# Patient Record
Sex: Female | Born: 1980 | Race: White | Hispanic: No | Marital: Married | Smoking: Never smoker
Health system: Southern US, Community
[De-identification: ages and names within clinical notes are randomized; demographics above are authoritative.]

## PROBLEM LIST (undated history)

## (undated) DIAGNOSIS — T8859XA Other complications of anesthesia, initial encounter: Secondary | ICD-10-CM

## (undated) DIAGNOSIS — Z9889 Other specified postprocedural states: Secondary | ICD-10-CM

## (undated) DIAGNOSIS — R112 Nausea with vomiting, unspecified: Secondary | ICD-10-CM

## (undated) DIAGNOSIS — D649 Anemia, unspecified: Secondary | ICD-10-CM

## (undated) DIAGNOSIS — F419 Anxiety disorder, unspecified: Secondary | ICD-10-CM

## (undated) DIAGNOSIS — T4145XA Adverse effect of unspecified anesthetic, initial encounter: Secondary | ICD-10-CM

## (undated) DIAGNOSIS — K219 Gastro-esophageal reflux disease without esophagitis: Secondary | ICD-10-CM

## (undated) HISTORY — PX: ABDOMINAL HYSTERECTOMY: SHX81

---

## 2012-10-16 HISTORY — PX: CHOLECYSTECTOMY: SHX55

## 2012-11-01 ENCOUNTER — Observation Stay: Payer: Self-pay | Admitting: Surgery

## 2012-11-01 LAB — COMPREHENSIVE METABOLIC PANEL
Alkaline Phosphatase: 71 U/L (ref 50–136)
Bilirubin,Total: 0.3 mg/dL (ref 0.2–1.0)
Calcium, Total: 9.2 mg/dL (ref 8.5–10.1)
Co2: 23 mmol/L (ref 21–32)
Creatinine: 0.73 mg/dL (ref 0.60–1.30)
EGFR (African American): >60
EGFR (Non-African Amer.): >60
Osmolality: 281 (ref 275–301)
Sodium: 140 mmol/L (ref 136–145)

## 2012-11-01 LAB — URINALYSIS, COMPLETE
Bacteria: NONE SEEN
Glucose,UR: NEGATIVE mg/dL (ref 0–75)
Ketone: NEGATIVE
Leukocyte Esterase: NEGATIVE
RBC,UR: NONE SEEN /HPF (ref 0–5)
Specific Gravity: 1.009 (ref 1.003–1.030)
WBC UR: <1 /HPF (ref 0–5)

## 2012-11-01 LAB — CBC
HCT: 35.5 % (ref 35.0–47.0)
MCH: 30.5 pg (ref 26.0–34.0)
MCHC: 34.6 g/dL (ref 32.0–36.0)

## 2012-11-01 LAB — LIPASE, BLOOD: Lipase: 180 U/L (ref 73–393)

## 2012-11-01 LAB — PREGNANCY, URINE: Pregnancy Test, Urine: NEGATIVE m[IU]/mL

## 2012-11-04 LAB — PATHOLOGY REPORT

## 2013-10-20 DIAGNOSIS — R197 Diarrhea, unspecified: Secondary | ICD-10-CM | POA: Insufficient documentation

## 2013-10-20 DIAGNOSIS — Z9889 Other specified postprocedural states: Secondary | ICD-10-CM | POA: Insufficient documentation

## 2014-06-09 DIAGNOSIS — R55 Syncope and collapse: Secondary | ICD-10-CM | POA: Insufficient documentation

## 2014-08-07 ENCOUNTER — Ambulatory Visit: Payer: Self-pay | Admitting: Gastroenterology

## 2015-02-05 NOTE — H&P (Signed)
PATIENT NAME:  Erica Wells, Erica Wells MR#:  [id number] DATE OF BIRTH:  04-23-1981  DATE OF ADMISSION:  11/01/2012  ADMITTING DIAGNOSES:  Right upper quadrant abdominal pain, possible cholecystitis.   HISTORY OF PRESENT ILLNESS:  This is a delightful 34 year old white female without past medical history except for hypertriglyceridemia who presents to the Emergency Room with the sudden onset of epigastric and right upper quadrant abdominal pain which radiated into her right  infrascapular region starting around 9:00 last night.  The pain progressed, associated with nausea, but no vomiting, no jaundice.  No sick contacts.  No change in bowel habits.  Pain became worse.  She sought treatment in the Emergency Room.  Upon arrival the patient's liver function tests appeared to be normal.  White count is normal.  Platelet normal.  Urinalysis negative.  Urine pregnancy test was negative.  Contrasted CT scan was obtained which demonstrates some inflammatory changes around the gallbladder.  There is no evidence of acute appendicitis.  Remaining solid organs to my review appeared to be normal.  Surgical services were asked to consult.   The patient does admit to having a similar, but less intense episode of pain on Monday which lasted approximately 6 hours with a pain-free interval in between.   ALLERGIES:  PENICILLIN.   MEDICATIONS:  Omeprazole, gemfibrozil and birth control pills.   PAST SURGICAL HISTORY:  None.   SOCIAL HISTORY:  The patient is employed at [employer].  Does not smoke.  Does not drink.   PHYSICAL EXAMINATION: GENERAL:  The patient is alert and oriented, anicteric.  No apparent distress.  VITAL SIGNS:  Temperature is 98, pulse is 97, blood pressure is 123/77, respiratory rate is 17. She is 5 feet, 8, 150 pounds, BMI 22.8.  HEENT AND NECK:  Supple.  No adenopathy or thyromegaly.  Extraocular muscles appear to be intact.  Facies are symmetrical and normal.  LUNGS:  Clear bilaterally.  HEART:  Regular  rate and rhythm.  No murmur.  ABDOMEN:  Soft and nontender, except in the right upper quadrant to deep palpation.  Consistent with a Murphy's sign.  EXTREMITIES:  Warm and well-perfused.  NEUROLOGIC AND PSYCHIATRIC:  Appears to be normal.  MUSCULOSKELETAL:  Normal tone and strength.   SKIN:  Warm and well-perfused.  No jaundice.  No scleral icterus.  RECTAL AND GENITOURINARY:  Deferred.   LABORATORY VALUES:  Glucose 91, BUN 19, creatinine 0.73, sodium 140, potassium 3.7, chloride 108.  Total protein is 8.4, albumin 4.1, bilirubin 0.3, alkaline phosphatase 71, AST 18, ALT 17.  White count is 8.7, hemoglobin 12.3, hematocrit 35.5, platelet count 316,000.  Urinalysis is negative.  Urine pregnancy test is negative.   IMPRESSION:  A 34 year old otherwise healthy white female presents to the Emergency Room with acute abdominal pain suspicious for cholecystitis based on history and CT scan findings.   PLAN:  The patient will be admitted, hydrated for her elevated BUN as she has some signs of mild dehydration based on that.  I will obtain an ultrasound of the right upper quadrant to further delineate the biliary anatomy.  I will have Dr. Leanora Cover check on her later today for consideration of cholecystectomy if her symptoms do not improve.  I discussed this with her.  She understands and wishes to proceed with hospital admission work-up.     ____________________________ Jeannette How. Marina Gravel, MD Ricke Hey D: 11/01/2012 06:09:40 ET T: 11/01/2012 06:38:19 ET JOB#: [id number]  cc: Elta Guadeloupe A. Marina Gravel, MD, <Dictator> Starsky Nanna Bettina Gavia MD ELECTRONICALLY SIGNED  11/01/2012 9:19 

## 2015-02-05 NOTE — Op Note (Signed)
PATIENT NAME:  Erica Wells, Erica Wells MR#:  [id number] DATE OF BIRTH:  1980-12-16  DATE OF PROCEDURE:  11/01/2012  OPERATION PERFORMED: Laparoscopic cholecystectomy.   PREOPERATIVE DIAGNOSIS: Acute cholecystitis.   POSTOPERATIVE DIAGNOSIS: Symptomatic cholelithiasis.   SURGEON: Consuela Mimes, MD   ANESTHESIA: General.   PROCEDURE IN DETAIL: The patient was placed supine on the operating room table and prepped and draped in the usual sterile fashion. A 15 mmHg CO2 pneumoperitoneum was created via a Veress needle in the infraumbilical position, and this was replaced with a 5 mm trocar and 30 degree angled scope. Remaining trocars were placed under direct visualization. The fundus of the gallbladder was retracted superiorly and ventrally, and adhesions, which were chronic in nature, were taken down from the visceral surface of the liver in segment 5 and the visceral surface of the gallbladder with the electrocautery and sharp and blunt dissection respectively. The infundibulum was then retracted laterally, opening up the triangle of Calot. Visualization was excellent, and fat in the triangle was minimal. Dissection right next to the gallbladder wall revealed a clear cystic duct and clear cystic artery. Both of these were individually doubly clipped and divided, and the gallbladder was removed from the liver bed with the electrocautery, placed in an Endo Catch bag and extracted from the abdomen via the epigastric port. This port site fascia was closed with a single 0 Vicryl suture with the laparoscopic puncture closure device, and the right upper quadrant was irrigated with warm normal saline. This was suctioned clear, and the area of dissection was reinspected, and hemostasis was excellent, and the clips were secure and there was no evidence of bile staining or leakage. Therefore, the peritoneum was desufflated and decannulated, and all 4 skin sites were closed with subcuticular 5-0 Monocryl and suture  strips. The patient tolerated the procedure well. There were no complications.    ____________________________ Consuela Mimes, MD wfm:OSi D: 11/01/2012 14:14:21 ET T: 11/02/2012 09:24:21 ET JOB#: [id number]  cc: Consuela Mimes, MD, <Dictator> Consuela Mimes MD ELECTRONICALLY SIGNED 11/03/2012 9:03

## 2015-02-08 LAB — SURGICAL PATHOLOGY

## 2016-06-27 DIAGNOSIS — R87610 Atypical squamous cells of undetermined significance on cytologic smear of cervix (ASC-US): Secondary | ICD-10-CM | POA: Insufficient documentation

## 2016-06-27 HISTORY — DX: Atypical squamous cells of undetermined significance on cytologic smear of cervix (ASC-US): R87.610

## 2017-07-23 DIAGNOSIS — F411 Generalized anxiety disorder: Secondary | ICD-10-CM | POA: Insufficient documentation

## 2017-07-23 DIAGNOSIS — F902 Attention-deficit hyperactivity disorder, combined type: Secondary | ICD-10-CM | POA: Insufficient documentation

## 2017-08-28 ENCOUNTER — Ambulatory Visit (INDEPENDENT_AMBULATORY_CARE_PROVIDER_SITE_OTHER): Payer: 59 | Admitting: Psychology

## 2017-08-28 DIAGNOSIS — F411 Generalized anxiety disorder: Secondary | ICD-10-CM | POA: Diagnosis not present

## 2017-09-04 ENCOUNTER — Ambulatory Visit: Payer: 59 | Admitting: Psychology

## 2017-09-26 ENCOUNTER — Ambulatory Visit (INDEPENDENT_AMBULATORY_CARE_PROVIDER_SITE_OTHER): Payer: 59 | Admitting: Psychology

## 2017-09-26 DIAGNOSIS — F411 Generalized anxiety disorder: Secondary | ICD-10-CM | POA: Diagnosis not present

## 2017-10-15 ENCOUNTER — Other Ambulatory Visit: Payer: Self-pay | Admitting: Orthopedic Surgery

## 2017-10-15 DIAGNOSIS — M1611 Unilateral primary osteoarthritis, right hip: Secondary | ICD-10-CM

## 2017-10-19 ENCOUNTER — Ambulatory Visit (INDEPENDENT_AMBULATORY_CARE_PROVIDER_SITE_OTHER): Payer: 59 | Admitting: Psychology

## 2017-10-19 DIAGNOSIS — F411 Generalized anxiety disorder: Secondary | ICD-10-CM

## 2017-10-23 ENCOUNTER — Ambulatory Visit
Admission: RE | Admit: 2017-10-23 | Discharge: 2017-10-23 | Disposition: A | Discharge status: Home / Self Care | Payer: Managed Care, Other (non HMO) | Source: Ambulatory Visit | Attending: Orthopedic Surgery | Admitting: Orthopedic Surgery

## 2017-10-23 DIAGNOSIS — M1611 Unilateral primary osteoarthritis, right hip: Secondary | ICD-10-CM | POA: Insufficient documentation

## 2017-11-08 ENCOUNTER — Other Ambulatory Visit: Payer: Self-pay | Admitting: Orthopedic Surgery

## 2017-11-08 DIAGNOSIS — M25551 Pain in right hip: Secondary | ICD-10-CM

## 2017-11-15 ENCOUNTER — Other Ambulatory Visit: Payer: Self-pay | Admitting: Orthopedic Surgery

## 2017-11-15 DIAGNOSIS — M25551 Pain in right hip: Secondary | ICD-10-CM

## 2017-11-19 ENCOUNTER — Ambulatory Visit (INDEPENDENT_AMBULATORY_CARE_PROVIDER_SITE_OTHER): Payer: 59 | Admitting: Psychology

## 2017-11-19 DIAGNOSIS — F411 Generalized anxiety disorder: Secondary | ICD-10-CM

## 2017-11-23 ENCOUNTER — Ambulatory Visit
Admission: RE | Admit: 2017-11-23 | Discharge: 2017-11-23 | Disposition: A | Discharge status: Home / Self Care | Payer: Managed Care, Other (non HMO) | Source: Ambulatory Visit | Attending: Orthopedic Surgery | Admitting: Orthopedic Surgery

## 2017-11-23 DIAGNOSIS — M25551 Pain in right hip: Secondary | ICD-10-CM | POA: Diagnosis present

## 2017-11-26 ENCOUNTER — Other Ambulatory Visit: Payer: Self-pay

## 2017-11-26 ENCOUNTER — Encounter
Admission: RE | Admit: 2017-11-26 | Discharge: 2017-11-26 | Disposition: A | Discharge status: Home / Self Care | Payer: Managed Care, Other (non HMO) | Source: Ambulatory Visit | Attending: Orthopedic Surgery | Admitting: Orthopedic Surgery

## 2017-11-26 HISTORY — DX: Anemia, unspecified: D64.9

## 2017-11-26 HISTORY — DX: Other specified postprocedural states: R11.2

## 2017-11-26 HISTORY — DX: Nausea with vomiting, unspecified: Z98.890

## 2017-11-26 HISTORY — DX: Other complications of anesthesia, initial encounter: T88.59XA

## 2017-11-26 HISTORY — DX: Anxiety disorder, unspecified: F41.9

## 2017-11-26 HISTORY — DX: Gastro-esophageal reflux disease without esophagitis: K21.9

## 2017-11-26 HISTORY — DX: Adverse effect of unspecified anesthetic, initial encounter: T41.45XA

## 2017-11-26 NOTE — Patient Instructions (Signed)
Your procedure is scheduled on: 12-03-17 Report to Same Day Surgery 2nd floor medical mall [facility] Entrance-take elevator on left to 2nd floor.  Check in with surgery information desk.) To find out your arrival time please call [phone] between 1PM - 3PM on 11-30-17  Remember: Instructions that are not followed completely may result in serious medical risk, up to and including death, or upon the discretion of your surgeon and anesthesiologist your surgery may need to be rescheduled.    _x___ 1. Do not eat food after midnight the night before your procedure. NO GUM OR CANDY AFTER MIDNIGHT.  You may drink clear liquids up to 2 hours before you are scheduled to arrive at the hospital for your procedure.  Do not drink clear liquids within 2 hours of your scheduled arrival to the hospital.  Clear liquids include  --Water or Apple juice without pulp  --Clear carbohydrate beverage such as ClearFast or Gatorade  --Black Coffee or Clear Tea (No milk, no creamers, do not add anything to  the coffee or Tea      __x__ 2. No Alcohol for 24 hours before or after surgery.   __x__3. No Smoking or e-cigarettes for 24 prior to surgery.  Do not use any chewable tobacco products for at least 6 hour prior to surgery   ____  4. Bring all medications with you on the day of surgery if instructed.    __x__ 5. Notify your doctor if there is any change in your medical condition     (cold, fever, infections).    x___6. On the morning of surgery brush your teeth with toothpaste and water.  You may rinse your mouth with mouth wash if you wish.  Do not swallow any toothpaste or mouthwash.   Do not wear jewelry, make-up, hairpins, clips or nail polish.  Do not wear lotions, powders, or perfumes. You may wear deodorant.  Do not shave 48 hours prior to surgery. Men may shave face and neck.  Do not bring valuables to the hospital.    [facility] is not responsible for any belongings or valuables.       Contacts, dentures or bridgework may not be worn into surgery.  Leave your suitcase in the car. After surgery it may be brought to your room.  For patients admitted to the hospital, discharge time is determined by your treatment team.  _  Patients discharged the day of surgery will not be allowed to drive home.  You will need someone to drive you home and stay with you the night of your procedure.    Please read over the following fact sheets that you were given:   [facility] Preparing for Surgery and or MRSA Information   _x___ TAKE THE FOLLOWING MEDICATIONS THE MORNING OF SURGERY WITH A SMALL SIP OF WATER. These include:  1. COLESTID  2. ZANTAC  3. PREVACID  4. YOU MAY TAKE A KLONOPIN THE MORNING OF SURGERY WITH A SMALL SIP OF WATER  5.  6.  ____Fleets enema or Magnesium Citrate as directed.   ____ Use CHG Soap or sage wipes as directed on instruction sheet   ____ Use inhalers on the day of surgery and bring to hospital day of surgery  ____ Stop Metformin and Janumet 2 days prior to surgery.    ____ Take 1/2 of usual insulin dose the night before surgery and none on the morning surgery.   ____ Follow recommendations from Cardiologist, Pulmonologist or PCP regarding stopping  Aspirin, Coumadin, Plavix ,Eliquis, Effient, or Pradaxa, and Pletal.  X____Stop Anti-inflammatories such as Advil, ALEVE, IBUPROFEN, Motrin, Naproxen, Naprosyn, Goodies powders or aspirin products. OK to take Tylenol    ____ Stop supplements until after surgery.     ____ Bring C-Pap to the hospital.

## 2017-11-30 ENCOUNTER — Ambulatory Visit: Payer: Self-pay

## 2017-12-02 MED ORDER — CLINDAMYCIN PHOSPHATE 900 MG/50ML IV SOLN
900.0000 mg | Freq: Once | INTRAVENOUS | Status: AC
  Administered 2017-12-03 (×2): 900 mg via INTRAVENOUS

## 2017-12-03 ENCOUNTER — Other Ambulatory Visit: Payer: Self-pay

## 2017-12-03 ENCOUNTER — Observation Stay
Admission: RE | Admit: 2017-12-03 | Discharge: 2017-12-04 | Disposition: A | Discharge status: Home / Self Care | Payer: Managed Care, Other (non HMO) | Source: Ambulatory Visit | Attending: Orthopedic Surgery | Admitting: Orthopedic Surgery

## 2017-12-03 ENCOUNTER — Encounter: Payer: Self-pay | Admitting: *Deleted

## 2017-12-03 ENCOUNTER — Encounter
Admission: RE | Disposition: A | Discharge status: Home / Self Care | Payer: Self-pay | Source: Ambulatory Visit | Attending: Orthopedic Surgery

## 2017-12-03 ENCOUNTER — Ambulatory Visit: Payer: Managed Care, Other (non HMO) | Admitting: Anesthesiology

## 2017-12-03 ENCOUNTER — Observation Stay: Payer: Managed Care, Other (non HMO)

## 2017-12-03 DIAGNOSIS — Z79899 Other long term (current) drug therapy: Secondary | ICD-10-CM | POA: Insufficient documentation

## 2017-12-03 DIAGNOSIS — K219 Gastro-esophageal reflux disease without esophagitis: Secondary | ICD-10-CM | POA: Diagnosis not present

## 2017-12-03 DIAGNOSIS — X58XXXA Exposure to other specified factors, initial encounter: Secondary | ICD-10-CM | POA: Diagnosis not present

## 2017-12-03 DIAGNOSIS — S73191A Other sprain of right hip, initial encounter: Secondary | ICD-10-CM | POA: Diagnosis present

## 2017-12-03 DIAGNOSIS — Z83511 Family history of glaucoma: Secondary | ICD-10-CM | POA: Diagnosis not present

## 2017-12-03 DIAGNOSIS — M25851 Other specified joint disorders, right hip: Principal | ICD-10-CM | POA: Insufficient documentation

## 2017-12-03 DIAGNOSIS — Z8371 Family history of colonic polyps: Secondary | ICD-10-CM | POA: Insufficient documentation

## 2017-12-03 DIAGNOSIS — Z820 Family history of epilepsy and other diseases of the nervous system: Secondary | ICD-10-CM | POA: Insufficient documentation

## 2017-12-03 DIAGNOSIS — Z8249 Family history of ischemic heart disease and other diseases of the circulatory system: Secondary | ICD-10-CM | POA: Insufficient documentation

## 2017-12-03 DIAGNOSIS — Z800 Family history of malignant neoplasm of digestive organs: Secondary | ICD-10-CM | POA: Diagnosis not present

## 2017-12-03 DIAGNOSIS — Z9049 Acquired absence of other specified parts of digestive tract: Secondary | ICD-10-CM | POA: Insufficient documentation

## 2017-12-03 DIAGNOSIS — Z880 Allergy status to penicillin: Secondary | ICD-10-CM | POA: Diagnosis not present

## 2017-12-03 DIAGNOSIS — Y939 Activity, unspecified: Secondary | ICD-10-CM | POA: Diagnosis not present

## 2017-12-03 DIAGNOSIS — Z807 Family history of other malignant neoplasms of lymphoid, hematopoietic and related tissues: Secondary | ICD-10-CM | POA: Diagnosis not present

## 2017-12-03 DIAGNOSIS — E785 Hyperlipidemia, unspecified: Secondary | ICD-10-CM | POA: Diagnosis not present

## 2017-12-03 DIAGNOSIS — Z808 Family history of malignant neoplasm of other organs or systems: Secondary | ICD-10-CM | POA: Diagnosis not present

## 2017-12-03 DIAGNOSIS — Z419 Encounter for procedure for purposes other than remedying health state, unspecified: Secondary | ICD-10-CM

## 2017-12-03 DIAGNOSIS — Z803 Family history of malignant neoplasm of breast: Secondary | ICD-10-CM | POA: Insufficient documentation

## 2017-12-03 DIAGNOSIS — F419 Anxiety disorder, unspecified: Secondary | ICD-10-CM | POA: Insufficient documentation

## 2017-12-03 DIAGNOSIS — Z7982 Long term (current) use of aspirin: Secondary | ICD-10-CM | POA: Diagnosis not present

## 2017-12-03 DIAGNOSIS — M25859 Other specified joint disorders, unspecified hip: Secondary | ICD-10-CM | POA: Diagnosis present

## 2017-12-03 HISTORY — PX: HIP ARTHROSCOPY: SHX668

## 2017-12-03 LAB — POCT PREGNANCY, URINE: Preg Test, Ur: NEGATIVE

## 2017-12-03 SURGERY — ARTHROSCOPY HIP
Anesthesia: General | Laterality: Right

## 2017-12-03 MED ORDER — ROPIVACAINE HCL 5 MG/ML IJ SOLN
INTRAMUSCULAR | Status: AC
  Filled 2017-12-03: qty 20

## 2017-12-03 MED ORDER — DEXAMETHASONE SODIUM PHOSPHATE 10 MG/ML IJ SOLN
INTRAMUSCULAR | Status: DC | PRN
  Administered 2017-12-03: 6 mg via INTRAVENOUS

## 2017-12-03 MED ORDER — EPINEPHRINE 30 MG/30ML IJ SOLN
INTRAMUSCULAR | Status: AC
  Filled 2017-12-03: qty 2

## 2017-12-03 MED ORDER — ACETAMINOPHEN 650 MG RE SUPP: 650.0000 mg | Freq: Four times a day (QID) | RECTAL | Status: DC | PRN

## 2017-12-03 MED ORDER — MORPHINE SULFATE (PF) 4 MG/ML IV SOLN
INTRAVENOUS | Status: DC | PRN
  Administered 2017-12-03: 4 mg via INTRAMUSCULAR

## 2017-12-03 MED ORDER — LIDOCAINE HCL (PF) 1 % IJ SOLN
INTRAMUSCULAR | Status: AC
  Filled 2017-12-03: qty 30

## 2017-12-03 MED ORDER — PHENYLEPHRINE HCL 10 MG/ML IJ SOLN
INTRAMUSCULAR | Status: AC
  Filled 2017-12-03: qty 1

## 2017-12-03 MED ORDER — ACETAMINOPHEN 325 MG PO TABS: 650.0000 mg | ORAL_TABLET | Freq: Four times a day (QID) | ORAL | Status: DC | PRN

## 2017-12-03 MED ORDER — COLESTIPOL HCL 1 G PO TABS
2.0000 g | ORAL_TABLET | Freq: Two times a day (BID) | ORAL | Status: DC
  Administered 2017-12-03 – 2017-12-04 (×2): 2 g via ORAL
  Filled 2017-12-03 (×2): qty 2

## 2017-12-03 MED ORDER — CELECOXIB 200 MG PO CAPS
ORAL_CAPSULE | ORAL | Status: AC
  Administered 2017-12-03: 200 mg via ORAL
  Filled 2017-12-03: qty 1

## 2017-12-03 MED ORDER — NORETHIN ACE-ETH ESTRAD-FE 1-20 MG-MCG(24) PO TABS
1.0000 | ORAL_TABLET | Freq: Every day | ORAL | Status: DC
  Filled 2017-12-03: qty 1

## 2017-12-03 MED ORDER — ACETAMINOPHEN 10 MG/ML IV SOLN
INTRAVENOUS | Status: AC
Start: 2017-12-03 — End: ?
  Filled 2017-12-03: qty 100

## 2017-12-03 MED ORDER — LIDOCAINE HCL (PF) 1 % IJ SOLN
INTRAMUSCULAR | Status: DC | PRN
  Administered 2017-12-03: 20 mL

## 2017-12-03 MED ORDER — SUGAMMADEX SODIUM 200 MG/2ML IV SOLN
INTRAVENOUS | Status: DC | PRN
  Administered 2017-12-03: 200 mg via INTRAVENOUS

## 2017-12-03 MED ORDER — HYDROMORPHONE HCL 1 MG/ML IJ SOLN
INTRAMUSCULAR | Status: AC
  Administered 2017-12-03: 0.5 mg via INTRAVENOUS
  Filled 2017-12-03: qty 1

## 2017-12-03 MED ORDER — AMITRIPTYLINE HCL 10 MG PO TABS
10.0000 mg | ORAL_TABLET | Freq: Every day | ORAL | Status: DC
  Administered 2017-12-03: 10 mg via ORAL
  Filled 2017-12-03: qty 1

## 2017-12-03 MED ORDER — FENTANYL CITRATE (PF) 100 MCG/2ML IJ SOLN
INTRAMUSCULAR | Status: AC
  Administered 2017-12-03: 25 ug via INTRAVENOUS
  Filled 2017-12-03: qty 2

## 2017-12-03 MED ORDER — SUCCINYLCHOLINE CHLORIDE 20 MG/ML IJ SOLN
INTRAMUSCULAR | Status: AC
  Filled 2017-12-03: qty 1

## 2017-12-03 MED ORDER — FAMOTIDINE 20 MG PO TABS
40.0000 mg | ORAL_TABLET | Freq: Every day | ORAL | Status: DC
  Administered 2017-12-03: 40 mg via ORAL
  Filled 2017-12-03: qty 2

## 2017-12-03 MED ORDER — GABAPENTIN 300 MG PO CAPS
ORAL_CAPSULE | ORAL | Status: AC
  Administered 2017-12-03: 300 mg
  Filled 2017-12-03: qty 1

## 2017-12-03 MED ORDER — ACETAMINOPHEN 500 MG PO TABS
1000.0000 mg | ORAL_TABLET | Freq: Three times a day (TID) | ORAL | Status: DC
  Administered 2017-12-03: 1000 mg via ORAL
  Filled 2017-12-03: qty 2

## 2017-12-03 MED ORDER — MIDAZOLAM HCL 2 MG/2ML IJ SOLN
INTRAMUSCULAR | Status: AC
  Filled 2017-12-03: qty 2

## 2017-12-03 MED ORDER — ONDANSETRON HCL 4 MG/2ML IJ SOLN: 4.0000 mg | Freq: Once | INTRAMUSCULAR | Status: DC | PRN

## 2017-12-03 MED ORDER — SUCCINYLCHOLINE CHLORIDE 20 MG/ML IJ SOLN
INTRAMUSCULAR | Status: DC | PRN
  Administered 2017-12-03: 100 mg via INTRAVENOUS

## 2017-12-03 MED ORDER — CLINDAMYCIN PHOSPHATE 900 MG/50ML IV SOLN
900.0000 mg | Freq: Three times a day (TID) | INTRAVENOUS | Status: AC
  Administered 2017-12-03 (×2): 900 mg via INTRAVENOUS
  Filled 2017-12-03 (×2): qty 50

## 2017-12-03 MED ORDER — CLONAZEPAM 0.5 MG PO TABS: 1.0000 mg | ORAL_TABLET | Freq: Every day | ORAL | Status: DC | PRN

## 2017-12-03 MED ORDER — ESCITALOPRAM OXALATE 10 MG PO TABS
20.0000 mg | ORAL_TABLET | Freq: Every day | ORAL | Status: DC
  Administered 2017-12-03: 20 mg via ORAL
  Filled 2017-12-03: qty 1

## 2017-12-03 MED ORDER — ROPIVACAINE HCL 5 MG/ML IJ SOLN
INTRAMUSCULAR | Status: DC | PRN
  Administered 2017-12-03: 20 mL via EPIDURAL

## 2017-12-03 MED ORDER — DIPHENHYDRAMINE HCL 25 MG PO CAPS
75.0000 mg | ORAL_CAPSULE | Freq: Every day | ORAL | Status: DC
  Administered 2017-12-03: 75 mg via ORAL
  Filled 2017-12-03: qty 3

## 2017-12-03 MED ORDER — ACETAMINOPHEN 500 MG PO TABS
ORAL_TABLET | ORAL | Status: AC
  Administered 2017-12-03: 1000 mg via ORAL
  Filled 2017-12-03: qty 1

## 2017-12-03 MED ORDER — CELECOXIB 200 MG PO CAPS
200.0000 mg | ORAL_CAPSULE | Freq: Once | ORAL | Status: AC
  Administered 2017-12-03: 200 mg via ORAL

## 2017-12-03 MED ORDER — PANTOPRAZOLE SODIUM 40 MG PO TBEC
40.0000 mg | DELAYED_RELEASE_TABLET | Freq: Every day | ORAL | Status: DC
  Administered 2017-12-04: 40 mg via ORAL
  Filled 2017-12-03: qty 1

## 2017-12-03 MED ORDER — ROCURONIUM BROMIDE 50 MG/5ML IV SOLN
INTRAVENOUS | Status: AC
  Filled 2017-12-03: qty 1

## 2017-12-03 MED ORDER — LIDOCAINE HCL (PF) 2 % IJ SOLN
INTRAMUSCULAR | Status: AC
  Filled 2017-12-03: qty 10

## 2017-12-03 MED ORDER — HYDROMORPHONE HCL 1 MG/ML IJ SOLN: 0.5000 mg | INTRAMUSCULAR | Status: DC | PRN

## 2017-12-03 MED ORDER — SEVOFLURANE IN SOLN
RESPIRATORY_TRACT | Status: AC
  Filled 2017-12-03: qty 250

## 2017-12-03 MED ORDER — DEXAMETHASONE SODIUM PHOSPHATE 10 MG/ML IJ SOLN
INTRAMUSCULAR | Status: AC
  Filled 2017-12-03: qty 1

## 2017-12-03 MED ORDER — MINERAL OIL LIGHT 100 % EX OIL
TOPICAL_OIL | CUTANEOUS | Status: AC
  Filled 2017-12-03: qty 25

## 2017-12-03 MED ORDER — DOCUSATE SODIUM 100 MG PO CAPS
100.0000 mg | ORAL_CAPSULE | Freq: Two times a day (BID) | ORAL | Status: DC
  Administered 2017-12-03 – 2017-12-04 (×2): 100 mg via ORAL
  Filled 2017-12-03 (×2): qty 1

## 2017-12-03 MED ORDER — SODIUM CHLORIDE 0.9 % IJ SOLN
INTRAMUSCULAR | Status: AC
  Filled 2017-12-03: qty 10

## 2017-12-03 MED ORDER — LACTATED RINGERS IV SOLN
INTRAVENOUS | Status: DC
  Administered 2017-12-03 (×2): via INTRAVENOUS

## 2017-12-03 MED ORDER — ACETAMINOPHEN 10 MG/ML IV SOLN
INTRAVENOUS | Status: DC | PRN
  Administered 2017-12-03: 1000 mg via INTRAVENOUS

## 2017-12-03 MED ORDER — PROPOFOL 10 MG/ML IV BOLUS
INTRAVENOUS | Status: AC
  Filled 2017-12-03: qty 20

## 2017-12-03 MED ORDER — CLINDAMYCIN PHOSPHATE 900 MG/50ML IV SOLN
INTRAVENOUS | Status: AC
  Filled 2017-12-03: qty 50

## 2017-12-03 MED ORDER — ONDANSETRON HCL 4 MG PO TABS: 4.0000 mg | ORAL_TABLET | Freq: Four times a day (QID) | ORAL | Status: DC | PRN

## 2017-12-03 MED ORDER — ADULT MULTIVITAMIN W/MINERALS CH
1.0000 | ORAL_TABLET | Freq: Every day | ORAL | Status: DC
  Administered 2017-12-04: 1 via ORAL
  Filled 2017-12-03: qty 1

## 2017-12-03 MED ORDER — KETOROLAC TROMETHAMINE 30 MG/ML IJ SOLN
INTRAMUSCULAR | Status: DC | PRN
  Administered 2017-12-03: 30 mg via INTRAVENOUS

## 2017-12-03 MED ORDER — KETOROLAC TROMETHAMINE 15 MG/ML IJ SOLN
15.0000 mg | Freq: Once | INTRAMUSCULAR | Status: AC
  Administered 2017-12-03: 15 mg via INTRAVENOUS
  Filled 2017-12-03: qty 1

## 2017-12-03 MED ORDER — AMPHETAMINE-DEXTROAMPHETAMINE 5 MG PO TABS: 10.0000 mg | ORAL_TABLET | Freq: Every day | ORAL | Status: DC

## 2017-12-03 MED ORDER — SCOPOLAMINE 1 MG/3DAYS TD PT72
MEDICATED_PATCH | TRANSDERMAL | Status: AC
  Filled 2017-12-03: qty 1

## 2017-12-03 MED ORDER — SENNOSIDES-DOCUSATE SODIUM 8.6-50 MG PO TABS
1.0000 | ORAL_TABLET | Freq: Every evening | ORAL | Status: DC | PRN
  Administered 2017-12-04: 1 via ORAL
  Filled 2017-12-03: qty 1

## 2017-12-03 MED ORDER — NORETHIN ACE-ETH ESTRAD-FE 1-20 MG-MCG(24) PO TABS
1.0000 | ORAL_TABLET | Freq: Every day | ORAL | Status: DC
  Filled 2017-12-03: qty 28

## 2017-12-03 MED ORDER — MIDAZOLAM HCL 2 MG/2ML IJ SOLN
INTRAMUSCULAR | Status: DC | PRN
  Administered 2017-12-03: 2 mg via INTRAVENOUS

## 2017-12-03 MED ORDER — METHOCARBAMOL 500 MG PO TABS
500.0000 mg | ORAL_TABLET | Freq: Four times a day (QID) | ORAL | Status: DC | PRN
Start: 2017-12-03 — End: 2017-12-04
  Filled 2017-12-03: qty 1

## 2017-12-03 MED ORDER — ONDANSETRON HCL 4 MG/2ML IJ SOLN
INTRAMUSCULAR | Status: AC
  Filled 2017-12-03: qty 2

## 2017-12-03 MED ORDER — EPINEPHRINE 30 MG/30ML IJ SOLN
INTRAMUSCULAR | Status: DC | PRN
  Administered 2017-12-03: 16 mg

## 2017-12-03 MED ORDER — ACETAMINOPHEN 500 MG PO TABS
1000.0000 mg | ORAL_TABLET | Freq: Once | ORAL | Status: AC
  Administered 2017-12-03: 1000 mg via ORAL

## 2017-12-03 MED ORDER — FENTANYL CITRATE (PF) 250 MCG/5ML IJ SOLN
INTRAMUSCULAR | Status: AC
  Filled 2017-12-03: qty 5

## 2017-12-03 MED ORDER — SCOPOLAMINE 1 MG/3DAYS TD PT72
1.0000 | MEDICATED_PATCH | TRANSDERMAL | Status: DC
  Administered 2017-12-03: 1.5 mg via TRANSDERMAL

## 2017-12-03 MED ORDER — FENTANYL CITRATE (PF) 100 MCG/2ML IJ SOLN
INTRAMUSCULAR | Status: DC | PRN
  Administered 2017-12-03 (×5): 50 ug via INTRAVENOUS

## 2017-12-03 MED ORDER — OXYCODONE HCL 5 MG PO TABS
5.0000 mg | ORAL_TABLET | ORAL | Status: DC | PRN
  Administered 2017-12-03: 5 mg via ORAL
  Administered 2017-12-04 (×2): 10 mg via ORAL
  Administered 2017-12-04: 5 mg via ORAL
  Filled 2017-12-03: qty 2
  Filled 2017-12-03: qty 1
  Filled 2017-12-03: qty 2
  Filled 2017-12-03: qty 1

## 2017-12-03 MED ORDER — CEFAZOLIN SODIUM 1 G IJ SOLR
INTRAMUSCULAR | Status: AC
  Filled 2017-12-03: qty 20

## 2017-12-03 MED ORDER — GABAPENTIN 600 MG PO TABS
300.0000 mg | ORAL_TABLET | Freq: Once | ORAL | Status: DC
  Filled 2017-12-03: qty 0.5

## 2017-12-03 MED ORDER — ONDANSETRON HCL 4 MG/2ML IJ SOLN
INTRAMUSCULAR | Status: DC | PRN
  Administered 2017-12-03: 4 mg via INTRAVENOUS

## 2017-12-03 MED ORDER — MORPHINE SULFATE (PF) 4 MG/ML IV SOLN
INTRAVENOUS | Status: AC
  Filled 2017-12-03: qty 1

## 2017-12-03 MED ORDER — METHOCARBAMOL 1000 MG/10ML IJ SOLN
500.0000 mg | Freq: Four times a day (QID) | INTRAVENOUS | Status: DC | PRN
  Administered 2017-12-03: 500 mg via INTRAVENOUS
  Filled 2017-12-03 (×2): qty 5

## 2017-12-03 MED ORDER — PROPOFOL 10 MG/ML IV BOLUS
INTRAVENOUS | Status: DC | PRN
  Administered 2017-12-03: 180 mg via INTRAVENOUS

## 2017-12-03 MED ORDER — ROCURONIUM BROMIDE 100 MG/10ML IV SOLN
INTRAVENOUS | Status: DC | PRN
  Administered 2017-12-03: 10 mg via INTRAVENOUS
  Administered 2017-12-03: 35 mg via INTRAVENOUS
  Administered 2017-12-03: 15 mg via INTRAVENOUS
  Administered 2017-12-03: 10 mg via INTRAVENOUS
  Administered 2017-12-03: 20 mg via INTRAVENOUS

## 2017-12-03 MED ORDER — DIPHENHYDRAMINE HCL 12.5 MG/5ML PO ELIX: 12.5000 mg | ORAL_SOLUTION | ORAL | Status: DC | PRN

## 2017-12-03 MED ORDER — HYDROMORPHONE HCL 1 MG/ML IJ SOLN
0.5000 mg | INTRAMUSCULAR | Status: DC | PRN
  Administered 2017-12-03 (×3): 0.5 mg via INTRAVENOUS

## 2017-12-03 MED ORDER — FENTANYL CITRATE (PF) 100 MCG/2ML IJ SOLN
25.0000 ug | INTRAMUSCULAR | Status: AC | PRN
  Administered 2017-12-03 (×6): 25 ug via INTRAVENOUS

## 2017-12-03 MED ORDER — ONDANSETRON HCL 4 MG/2ML IJ SOLN
4.0000 mg | Freq: Four times a day (QID) | INTRAMUSCULAR | Status: DC | PRN
  Administered 2017-12-04: 4 mg via INTRAVENOUS
  Filled 2017-12-03: qty 2

## 2017-12-03 MED ORDER — SODIUM CHLORIDE 0.9 % IV SOLN
INTRAVENOUS | Status: DC
  Administered 2017-12-03: 18:00:00 via INTRAVENOUS

## 2017-12-03 MED ORDER — AMPHETAMINE-DEXTROAMPHET ER 5 MG PO CP24: 20.0000 mg | ORAL_CAPSULE | Freq: Every day | ORAL | Status: DC

## 2017-12-03 SURGICAL SUPPLY — 57 items
ADAPTER IRRIG TUBE 2 SPIKE SOL (ADAPTER) ×4 IMPLANT
ANCHOR SUT 2.4 SS KNTLS #1 (Anchor) ×10 IMPLANT
BIT DRILL SS CINCHLOCK (BIT) ×4 IMPLANT
BLADE SAMURAI STR FULL RADIUS (BLADE) ×2 IMPLANT
BLADE SURG SZ11 CARB STEEL (BLADE) ×2 IMPLANT
BUR 4.0 ROUND XL DIAMOND (BUR) ×2 IMPLANT
BUR 5.5 ROUND LONG FS 8 FLUTE (BUR) ×2 IMPLANT
CANNULA 8 789 TRANSPORT (CANNULA) ×4 IMPLANT
CANNULA OBTURATOR FLOWPORT (CANNULA) ×2 IMPLANT
CHLORAPREP W/TINT 26ML (MISCELLANEOUS) ×2 IMPLANT
CRADLE LAMINECT ARM (MISCELLANEOUS) ×2 IMPLANT
CUTTER AGGRESSIVE PLUS 4D 180L (CUTTER) ×2 IMPLANT
DRAPE C-ARM 42X72 X-RAY (DRAPES) ×2 IMPLANT
DRAPE SHEET LG 3/4 BI-LAMINATE (DRAPES) IMPLANT
DRAPE SURG 17X11 SM STRL (DRAPES) ×2 IMPLANT
GAUZE SPONGE 4X4 12PLY STRL (GAUZE/BANDAGES/DRESSINGS) ×2 IMPLANT
GLOVE BIOGEL PI IND STRL 8 (GLOVE) ×1 IMPLANT
GLOVE BIOGEL PI INDICATOR 8 (GLOVE) ×1
GLOVE SURG ORTHO 8.0 STRL STRW (GLOVE) ×4 IMPLANT
GLOVE SURG SYN 6.5 ES PF (GLOVE) ×4 IMPLANT
GOWN STRL REUS W/ TWL LRG LVL3 (GOWN DISPOSABLE) ×1 IMPLANT
GOWN STRL REUS W/ TWL XL LVL3 (GOWN DISPOSABLE) ×1 IMPLANT
GOWN STRL REUS W/TWL LRG LVL3 (GOWN DISPOSABLE) ×1
GOWN STRL REUS W/TWL XL LVL3 (GOWN DISPOSABLE) ×1
IV LACTATED RINGER IRRG 3000ML (IV SOLUTION) ×16
IV LR IRRIG 3000ML ARTHROMATIC (IV SOLUTION) ×16 IMPLANT
KIT PATIENT POSITION MEDIUM (KITS) IMPLANT
KIT PATIENT POSITION SMALL (KITS) ×2 IMPLANT
KIT PORTAL ENTRY HIP ACCESS (KITS) ×2 IMPLANT
KIT TURNOVER CYSTO (KITS) ×2 IMPLANT
MANIFOLD NEPTUNE II (INSTRUMENTS) ×2 IMPLANT
MAT BLUE FLOOR 46X72 FLO (MISCELLANEOUS) ×2 IMPLANT
NDL SAFETY ECLIPSE 18X1.5 (NEEDLE) ×1 IMPLANT
NEEDLE HYPO 18GX1.5 SHARP (NEEDLE) ×1
NEEDLE INJECTOR II CARTRIDGE (MISCELLANEOUS) ×4 IMPLANT
PACK ARTHROSCOPY KNEE (MISCELLANEOUS) ×4 IMPLANT
PACK UNIVERSAL (MISCELLANEOUS) ×2 IMPLANT
PAD ABD DERMACEA PRESS 5X9 (GAUZE/BANDAGES/DRESSINGS) ×2 IMPLANT
PAD PREP 24X41 OB/GYN DISP (PERSONAL CARE ITEMS) ×2 IMPLANT
PASSER SUT 1.5D CRESCENT (INSTRUMENTS) ×4 IMPLANT
PASSER SUT 70D UP ANGLED (INSTRUMENTS) ×2 IMPLANT
SERFAS 50-S SWEEP XL (INSTRUMENTS) ×2
SET TUBE SUCT SHAVER OUTFL 24K (TUBING) ×2 IMPLANT
STRYKER FORCE FIBER ×4 IMPLANT
SUT ETHILON 3-0 FS-10 30 BLK (SUTURE) ×2
SUT FORCE FIBER 2 38IN K BLUE (SUTURE) ×4
SUT ORTHOCORD 2X36 W/O NDL (SUTURE) ×2 IMPLANT
SUT VIC AB 2-0 CT2 27 (SUTURE) ×2 IMPLANT
SUT ZIPLINE SZ2 BLK (SUTURE) ×6 IMPLANT
SUT ZIPLINE SZ2 GREEN (SUTURE) ×12 IMPLANT
SUTURE EHLN 3-0 FS-10 30 BLK (SUTURE) ×1 IMPLANT
SUTURE FORCE FIBER 2 38IN K BL (SUTURE) ×2 IMPLANT
SUTURE TAPE XBRAID 1.2 BLUE 45 (SUTURE) ×1 IMPLANT
SUTURETAPE XBRAID 1.2 BLUE 45 (SUTURE) ×2
TRAY FOLEY CATH SILVER 16FR LF (SET/KITS/TRAYS/PACK) ×2 IMPLANT
TUBING ARTHRO INFLOW-ONLY STRL (TUBING) ×2 IMPLANT
WAND SERFAS 50-S SWEEP XL (INSTRUMENTS) ×1 IMPLANT

## 2017-12-03 NOTE — Anesthesia Procedure Notes (Signed)
Procedure Name: Intubation Date/Time: 12/03/2017 7:53 AM Performed by: Philbert Riser, CRNA Pre-anesthesia Checklist: Patient identified, Emergency Drugs available, Suction available, Patient being monitored and Timeout performed Patient Re-evaluated:Patient Re-evaluated prior to induction Oxygen Delivery Method: Circle system utilized and Simple face mask Preoxygenation: Pre-oxygenation with 100% oxygen Induction Type: IV induction Ventilation: Mask ventilation without difficulty Laryngoscope Size: Mac and 3 Grade View: Grade I Tube type: Oral Tube size: 7.0 mm Number of attempts: 1 Airway Equipment and Method: Stylet Placement Confirmation: ETT inserted through vocal cords under direct vision Secured at: 21 cm Tube secured with: Tape Dental Injury: Teeth and Oropharynx as per pre-operative assessment

## 2017-12-03 NOTE — Anesthesia Post-op Follow-up Note (Signed)
Anesthesia QCDR form completed.        

## 2017-12-03 NOTE — H&P (Signed)
Paper H&P to be scanned into permanent record. H&P reviewed. No significant changes noted.  

## 2017-12-03 NOTE — Anesthesia Preprocedure Evaluation (Signed)
Anesthesia Evaluation  Patient identified by MRN, date of birth, ID band Patient awake    Reviewed: Allergy & Precautions, NPO status , Patient's Chart, lab work & pertinent test results  History of Anesthesia Complications (+) PONV and history of anesthetic complications  Airway Mallampati: I       Dental   Pulmonary neg sleep apnea, neg COPD,           Cardiovascular (-) hypertension(-) Past MI and (-) CHF (-) dysrhythmias (-) Valvular Problems/Murmurs     Neuro/Psych neg Seizures Anxiety    GI/Hepatic Neg liver ROS, GERD  Medicated and Poorly Controlled,  Endo/Other  neg diabetes  Renal/GU negative Renal ROS     Musculoskeletal   Abdominal   Peds  Hematology   Anesthesia Other Findings   Reproductive/Obstetrics                             Anesthesia Physical Anesthesia Plan  ASA: II  Anesthesia Plan: General   Post-op Pain Management:    Induction: Intravenous, Rapid sequence and Cricoid pressure planned  PONV Risk Score and Plan: 4 or greater and Midazolam, Propofol infusion, Dexamethasone and Diphenhydramine  Airway Management Planned: Oral ETT  Additional Equipment:   Intra-op Plan:   Post-operative Plan:   Informed Consent: I have reviewed the patients History and Physical, chart, labs and discussed the procedure including the risks, benefits and alternatives for the proposed anesthesia with the patient or authorized representative who has indicated his/her understanding and acceptance.     Plan Discussed with:   Anesthesia Plan Comments:         Anesthesia Quick Evaluation

## 2017-12-03 NOTE — Discharge Instructions (Signed)
Hip Arthroscopy Post-Operative Instructions  1. Physical Therapy should start within 3-4 days of surgery. 2. If oozing from surgery site occurs, and the dressing appears soaked with bloody fluid please change the dressing as needed. This normally occurs after fluid irrigation during surgery, and will resolve within 24-36 hours. 3. Icing is very important for the first 5-7 days postoperative, and ice is applied (ice packs or ice therapy) as often as possible or at least for 20-minute periods 3-4 times per day. Ice should not be applied directly on the skin. 4. Physical therapist will remove dressing. 5. Apply Band-Aids to wound sites and change them once a day. Keep the wound clean and dry. 6. Please do not use bacitracin or other ointments under the bandage. 7. Showering is allowed on post-op day #4 if the wound is dry. MAKE SURE EACH INCISION IS COVERED WITH A WATERPROOF BANDAID DURING SHOWER ONLY! 8. Do not soak the hip in water in a bathtub or pool until the sutures are removed. Typically getting into a bath or pool is permitted 4 weeks after surgery.  9. Driving is permitted after 1 week for L hip surgery only if the narcotic pain medication is no longer being taken and you feel comfortable getting into and out of a car. For R hip surgery, driving is permitted after 2 weeks. Driving a manual car may take up to 3-4 weeks. 10. Please ensure you have a follow-up appointment for suture removal ~2 weeks after surgery.  11. The anesthetic drugs used during your surgery may cause nausea for the first 24 hours. If nausea is encountered, drink only clear liquids (i.e. Sprite or 7-up). The only solids should be dry crackers or toast. If nausea and vomiting become severe or the patient shows sign of dehydration (lack of urination) please call the doctor or the surgicenter. 12. If you develop a fever (101.5), redness, or yellow/brown/green drainage from the surgical incision site, please call our office to  arrange for an evaluation.  13: POST-OPERATIVE PRESCRIPTIONS:  HETERTOPIC BONE PROPHYLAXIS FOR 10 DAYS: 1. EC-Naprosyn 500mg , 1 tablet by mouth two times per day x 30 days 2. Prilosec (Stomach Prophylaxis) 20mg , 1 tablet by mouth daily (take on an empty stomach x 30 days  DVT PROPHYLAXIS 3. Aspirin 325mg  by mouth daily x 2 weeks  PAIN MEDICATION:  4. Oxycodone 1 to 3 tablets by mouth every 4 hours as needed 5. Tylenol 1000mg  three times day for at least 3 days, then as needed to reduce narcotics  ANTI-NAUSEA (if applicable):  6. Zofran 4mg  tablet, 1 tablet every 6 hours as needed. You will be given a prescription, but it is optional to fill it.  ANTI-SPASM (if applicable):  7. Zanaflex 4mg , 2 tablets by mouth every 6 hours as needed.  14. You will take as aspirin (325 mg) daily x 2 weeks. This may lower the risk of a blood clot developing after surgery. Should severe calf pain occur or significant swelling of calf and ankle, please call our offices. 15. Local anesthetics (i.e. Novocaine) are put into the incision after surgery. It is not uncommon for patients to encounter more pain on the first or second day after surgery. This is the time when swelling peaks. Taking pain medication before bedtime will assist in sleeping. It is important not to drink or drive while taking narcotic medication. You should resume your normal medications for other conditions the day after surgery. 16. Follow weight bearing instructions as advised at discharge. Crutches may  be necessary to assist walking. Extremity elevation for the first 72 hours is also encouraged to minimize swelling. 17. If unexpected problems occur and you need to speak to the doctor, call the office.   Important Personal assistant Musician): [phone] Fax Number: [phone]

## 2017-12-03 NOTE — Anesthesia Postprocedure Evaluation (Signed)
Anesthesia Post Note  Patient: Erica Wells  Procedure(s) Performed: ARTHROSCOPY HIP (Right )  Patient location during evaluation: PACU Anesthesia Type: General Level of consciousness: awake and alert Pain management: pain level controlled Vital Signs Assessment: post-procedure vital signs reviewed and stable Respiratory status: spontaneous breathing, nonlabored ventilation, respiratory function stable and patient connected to nasal cannula oxygen Cardiovascular status: blood pressure returned to baseline and stable Postop Assessment: no apparent nausea or vomiting Anesthetic complications: no     Last Vitals:  Vitals:   12/03/17 1605 12/03/17 1613  BP:  129/84  Pulse: 88 100  Resp: 18 15  Temp:  37.2 C  SpO2: 96% 97%    Last Pain:  Vitals:   12/03/17 1613  TempSrc:   PainSc: [place]

## 2017-12-03 NOTE — Op Note (Addendum)
Operative Note   SURGERY DATE: 12/03/2017  PRE-OP DIAGNOSIS:  1. Right femoroacetabular impingement 2. Right hip labral tear  POST-OP DIAGNOSIS: 1. Right femoroacetabular impingement 2. Right hip labral tear  PROCEDURES:  1. Right hip arthroscopy with acetabuloplasty, labral repair, femoral osteochondroplasty, and capsular closure  SURGEON: Cato Mulligan, MD  ASSISTANT: Reche Dixon, PA  ANESTHESIA: Gen  ESTIMATED BLOOD LOSS:minimal  TOTAL IV FLUIDS: per anesthesia  INDICATION(S): The patient is a 37 y.o. year old female who presents with persistent hip pain.  Radiographs demonstrated FAI morphology and clinical picture suggested a labral tear.  She has failed greater than 6 months of non-operative treatment to date including activity modifications, physical therapy, and corticosteroid injection.  Please see the preoperative notes for further detail.   She elected to undergo the above mentioned procedure after detailed explanation of the expected outcomes and recovery path.   Informed consent was obtained outlining the expected benefits and possible risks of the surgery including a less than 5% chance of numbness in the sciatic or pudendal nerve regions, 20% chance of injury to the lateral femoral cutaneous nerve (1% permanent injury). 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  OPERATIVE FINDINGS: Cartilage No significant degenerative changes of the acetabular cartilage along the labral tear High grade cartilage lesion: no Delamination: no Bone exposed: no Bruising: no Localization of femoral head high grade lesion: none  Cotyloid fossa osteophytes: none The remainder of the femoral and acetabular cartilage was normal.  Labrum Labral degeneration, yellow over 50% of labrum: no Hypoplastic labrum: no Hyperplastic labrum:  no Lipstick sign at the psoas  prominence: mild Psoas Prominence: no  Boundaries of labral tear Convention (3 o'clock anterior, 9 o'clock posterior) Anterior boundary: 1 o'clock Posterior boundary: 3 o'clock  Ligamentum teres Hypertrophy:  none Tear: none   OPERATIVE REPORT:  The patient was brought to the operating room, placed supine on the operating table, and bony prominences were padded.  The traction boots were applied with padding to ensure that safe traction could be applied through the feet.  The contralateral limb was abducted slightly and light traction was applied.  The operative leg was brought into neutral position.  Appropriate preoperative IV antibiotics were administered. The patient was prepped and draped in a sterile fashion.  Time-out was performed and landmarks were identified. An air arthrogram was obtained by injecting 30cc into the hip joint while traction was pulled. This broke the labral seal allowing for distraction of the hip. Care was taken to ensure the least amount of force necessary to allow safe access to the joint of 8-79mm.  This was checked with fluoroscopy.   Next we placed an anterolateral portal under the assistance of fluoroscopy.  First, fluoroscopy was used to estimate the trajectory and starting point.  A 44mm incision with a #11 blade was made and a straight hemostat was used to dilate the portal through the appropriate tract.  We then placed a 14-gauge hypodermic needle with careful technique to be as close to the femoral head as possible and parallel to the sourcil to ensure no iatrogenic damage to the labrum.  This released the negative pressure environment and the amount of traction was adjusted to maintain the 8-51mm of distraction.  A nitinol wire was placed through the needle and flouroscopy was used to ensure it extended to the medial wall of the acetabulum.  The [place] from [place]  was placed over the wire and the nitinol wire was retracted to just inside the  capsule during insertion of the dilator and cannula to minimize the risk of breakage. The arthroscope was placed next and we visualized the anterior triangle.  We then placed the anterior portal under direct visualization using the technique described above.  This was safely placed as well without damage to the labrum or femoral head.  We then switched our arthroscope to the anterior portal to ensure we were not through the labrum - we were safely through the capsule only.  We then proceeded with a transverse capsulotomy connecting the 2 portals in the same plane utilizing the Samurai blade from [place].  The Injector device from [place] was used to place traction stitches each in the medial and lateral arms of the proximal capsule.  A Kelly clamp was used to hold the suture against the skin to apply traction. This allowed access to the acetabular rim and labrum as well as protection of the native edges of the capsule.  We identified the anterior inferior iliac spine proximally, the psoas tendon medially and the rectus tendon laterally as landmarks.  We then proceeded with a diagnostic arthroscopy - the results can be found in the findings section above.    We then used the 50 degree hip specific radiofrequency device and a 16mm shaver to clear the superior acetabulum and expose the subspinous region.  Next we exposed the acetabular rim leaving the chondral labral junction intact.  Working from both portals, the acetabular rim/subspinous region was reshaped with a 4.34mm diamond burr consistent with the preoperative three-dimensional imaging.   When adequate reshaping was confirmed with fluoroscopy, we then proceeded with the labral repair.  A distal anterolateral portal was placed under direct visualization and the Transport cannula was inserted.  Care was taken to ensure the cannula was in the intermuscular plane between the gluteus minimus and iliocapsularis.  This portal was approximately 4cm  distal and 1cm anterior to the anterolateral portal.     We placed 3 anchors at the 1 o'clock, 2 o'clock and 3 o'clock positions with a vertical mattress  stitch at each of the above positions respectively. The sutures were passed using the crescent [place] from [place].  This resulted in anatomic labral repair.  We debrided the loose cartilage at the rim and residual degenerative labral tissue.  Traction was let down with total traction time of 156 minutes.    We then turned our attention to the peripheral compartment.  We flexed the hip to 45 degrees. We viewed from the anterior portal and worked from the distal anterolateral portal.  First we passed two traction stitches in the medial and lateral sides of the capsulotomy.  In between these sutures, we performed a T capsulotomy with the samurai blade from [place] down to the intertrochanteric line in the plane between the iliocapsularis and gluteus minimus. The position of the T-capsulotomy was checked with fluoroscopy to ensure a safe position along the anterolateral neck.  Next we placed one additional traction stitch in the lateral limb of the T-capsulotomy.  Clamps were used to hold these stitches against the skin for retraction.  Adequate mobilization and retraction was achieved such that we could view the entire CAM lesion. The capsule was retracted with a switching stick through the AL portal when necessary.  We were able to view the medial and lateral synovial folds, identifying the medial and lateral circumflex arteries.  These were  protected during the osteoplasty.  A 5.85mm burr was used to reshape the femoral neck.  The initial line of resection was defined with the use of dynamic exam and fluoroscopy.  The line was parallel to the acetabular rim with the leg in neutral rotation.  We viewed the base of the femoral neck to provide a foundation for the shape and size of the osteochondroplasty. We were able to adequately remove the cam  lesion and reshape the femoral neck.  This was confirmed with fluoroscopy.  Dynamic exam showed no residual impingement flexed to 90 degrees with maximal internal rotation and external rotation. Finally, we performed a complete capsular closure with Zipline suture from [place], utilizing the [place] from [place] to pass the suture.  Four figure of 8 stitches was placed for capsular closure: one in the vertical limb of the T-capsulotomy, one medial, one lateral, and one in between the medial and lateral interportal stitches capturing the apex of the T capsulotomy.  These were then tied sequentially with alternating half hitches through an 8.76mm Transport cannula. Reapproximation of the capsule was confirmed.   We then removed the arthroscope and closed the incisions with 2-0 Vicryl subdermally and 3-0 nylon stitches.  4mg  morphine was injected into the joint and local anesthetic was injected about the portals and tracts down to the joint. A sterile dressing was applied and hip brace was applied.  The patient was awakened from anesthesia and transferred to PACU in stable condition.   POST-OPERATIVE PLAN: Postoperative care includes overnight stay, 3 weeks of touch down weight-bearing and continuous passive motion device for 6-8 hours per day.  The patient will require a brace for 3 weeks locked at 50 degrees while sleeping and open to 90 degrees of flexion at all other times.  Formal physical therapy will begin this week. ASA x 2 weeks for DVT ppx. Naproxen for HO ppx for 30 days.

## 2017-12-03 NOTE — Transfer of Care (Signed)
Immediate Anesthesia Transfer of Care Note  Patient: Erica Wells  Procedure(s) Performed: ARTHROSCOPY HIP (Right )  Patient Location: PACU  Anesthesia Type:General  Level of Consciousness: awake, oriented and sedated  Airway & Oxygen Therapy: Patient Spontanous Breathing and Patient connected to face mask oxygen  Post-op Assessment: Report given to RN and Post -op Vital signs reviewed and stable  Post vital signs: Reviewed and stable  Last Vitals:  Vitals:   12/03/17 0622  BP: (!) 117/94  Pulse: 100  Resp: 17  Temp: 36.9 C  SpO2: 100%    Last Pain:  Vitals:   12/03/17 0622  TempSrc: Oral  PainSc: 4          Complications: No apparent anesthesia complications

## 2017-12-04 ENCOUNTER — Encounter: Payer: Self-pay | Admitting: Orthopedic Surgery

## 2017-12-04 DIAGNOSIS — M25851 Other specified joint disorders, right hip: Secondary | ICD-10-CM | POA: Diagnosis not present

## 2017-12-04 MED ORDER — ASPIRIN EC 325 MG PO TBEC: 325.0000 mg | DELAYED_RELEASE_TABLET | Freq: Every day | ORAL | 0 refills | Status: DC

## 2017-12-04 MED ORDER — ONDANSETRON HCL 4 MG PO TABS: 4.0000 mg | ORAL_TABLET | Freq: Four times a day (QID) | ORAL | 0 refills | Status: DC | PRN

## 2017-12-04 MED ORDER — ACETAMINOPHEN 500 MG PO TABS: 1000.0000 mg | ORAL_TABLET | Freq: Three times a day (TID) | ORAL | 0 refills | Status: DC

## 2017-12-04 MED ORDER — ASPIRIN EC 325 MG PO TBEC
325.0000 mg | DELAYED_RELEASE_TABLET | Freq: Once | ORAL | Status: AC
  Administered 2017-12-04: 325 mg via ORAL
  Filled 2017-12-04: qty 1

## 2017-12-04 MED ORDER — OXYCODONE HCL 5 MG PO TABS: 5.0000 mg | ORAL_TABLET | ORAL | 0 refills | Status: DC | PRN

## 2017-12-04 NOTE — Discharge Summary (Signed)
Physician Discharge Summary  Subjective: 1 Day Post-Op Procedure(s) (LRB): ARTHROSCOPY HIP (Right) Patient reports pain as moderate.   Patient seen in rounds with Dr. Posey Pronto. Patient is well, and has had no acute complaints or problems Patient is ready to go home after physical therapy this morning.  Physician Discharge Summary  Patient ID: Erica Wells MRN: [id number] DOB/AGE: 1981/04/13 37 y.o.  Admit date: 12/03/2017 Discharge date: 12/04/2017  Admission Diagnoses:  Discharge Diagnoses:  Active Problems:   Femoroacetabular impingement   Discharged Condition: fair  Hospital Course: The patient is postop day 1 from a right hip arthroscopy. She is doing well since surgery. She has been somewhat controlled with her pain management. She will do physical therapy this morning and will be able to go home after physical therapy.  Treatments: surgery:  1. Left hip arthroscopy with acetabuloplasty, labral repair, femoral osteochondroplasty, and capsular closure 2. Left trochanteric bursa corticosteroid injection  SURGEON: Cato Mulligan, MD  ASSISTANT: Reche Dixon, PA  ANESTHESIA: Gen  ESTIMATED BLOOD LOSS:minimal  TOTAL IV FLUIDS: per anesthesia    Discharge Exam: Blood pressure 127/82, pulse 78, temperature 98.2 F (36.8 C), temperature source Oral, resp. rate 16, height 5\' 3"  (1.6 m), weight 92.1 kg (203 lb), last menstrual period 11/18/2017, SpO2 100 %.   Disposition: Final discharge disposition not confirmed   Allergies as of 12/04/2017      Reactions   Penicillins Anaphylaxis   Has patient had a PCN reaction causing immediate rash, facial/tongue/throat swelling, SOB or lightheadedness with hypotension: Yes Has patient had a PCN reaction causing severe rash involving mucus membranes or skin necrosis: No Has patient had a PCN reaction that required hospitalization: Yes Has patient had a PCN reaction occurring within the last 10 years: No If all of the above  answers are "NO", then may proceed with Cephalosporin use.      Medication List    TAKE these medications   acetaminophen 500 MG tablet Commonly known as:  TYLENOL Take 2 tablets (1,000 mg total) by mouth every 8 (eight) hours.   amitriptyline 10 MG tablet Commonly known as:  ELAVIL Take 10 mg by mouth at bedtime.   amphetamine-dextroamphetamine 20 MG 24 hr capsule Commonly known as:  ADDERALL XR Take 20 mg by mouth daily.   amphetamine-dextroamphetamine 10 MG tablet Commonly known as:  ADDERALL Take 10 mg by mouth daily at 12 noon.   aspirin EC 325 MG tablet Take 1 tablet (325 mg total) by mouth daily.   clindamycin-benzoyl peroxide gel Commonly known as:  BENZACLIN Apply 1 application topically every other day.   clonazePAM 1 MG tablet Commonly known as:  KLONOPIN Take 1 mg by mouth daily as needed for anxiety.   colestipol 1 g tablet Commonly known as:  COLESTID Take 2 g by mouth 2 (two) times daily.   diphenhydrAMINE 25 MG tablet Commonly known as:  BENADRYL Take 75 mg by mouth at bedtime.   escitalopram 20 MG tablet Commonly known as:  LEXAPRO Take 20 mg by mouth at bedtime.   ibuprofen 200 MG tablet Commonly known as:  ADVIL,MOTRIN Take 400 mg by mouth 2 (two) times daily as needed for headache or moderate pain.   lansoprazole 30 MG capsule Commonly known as:  PREVACID Take 30 mg by mouth at bedtime.   LOMEDIA 24 FE 1-20 MG-MCG(24) tablet Generic drug:  Norethindrone Acetate-Ethinyl Estrad-FE Take 1 tablet by mouth daily.   multivitamin with minerals Tabs tablet Take 1 tablet by mouth daily.  naproxen sodium 220 MG tablet Commonly known as:  ALEVE Take 220 mg by mouth 2 (two) times daily as needed (pain).   ondansetron 4 MG tablet Commonly known as:  ZOFRAN Take 1 tablet (4 mg total) by mouth every 6 (six) hours as needed for nausea.   oxyCODONE 5 MG immediate release tablet Commonly known as:  Oxy IR/ROXICODONE Take 1-2 tablets (5-10 mg  total) by mouth every 4 (four) hours as needed for breakthrough pain.   ranitidine 300 MG tablet Commonly known as:  ZANTAC Take 300 mg by mouth at bedtime.      Follow-up Information    Leim Fabry, MD Follow up in 2 week(s).   Specialty:  Orthopedic Surgery Contact information: [address] [place] [address] [phone]           Signed: Prescott Parma, Daesia Zylka 12/04/2017, 7:27 AM   Objective: Vital signs in last 24 hours: Temp:  [98 F (36.7 C)-99.2 F (37.3 C)] 98.2 F (36.8 C) (02/19 0540) Pulse Rate:  [78-100] 78 (02/19 0540) Resp:  [15-23] 16 (02/19 0023) BP: (127-144)/(76-98) 127/82 (02/19 0540) SpO2:  [96 %-100 %] 100 % (02/19 0540)  Intake/Output from previous day:  Intake/Output Summary (Last 24 hours) at 12/04/2017 0727 Last data filed at 12/03/2017 1900 Gross per 24 hour  Intake 2815 ml  Output 20 ml  Net 2795 ml    Intake/Output this shift: No intake/output data recorded.  Labs: No results for input(s): HGB in the last 72 hours. No results for input(s): WBC, RBC, HCT, PLT in the last 72 hours. No results for input(s): NA, K, CL, CO2, BUN, CREATININE, GLUCOSE, CALCIUM in the last 72 hours. No results for input(s): LABPT, INR in the last 72 hours.  EXAM: General - Patient is Alert and Oriented Extremity - Neurovascular intact Dorsiflexion/Plantar flexion intact Compartment soft Incision - clean, dry, no drainage Motor Function -  plantarflexion and dorsiflexion are intact.  Assessment/Plan: 1 Day Post-Op Procedure(s) (LRB): ARTHROSCOPY HIP (Right) Procedure(s) (LRB): ARTHROSCOPY HIP (Right) Past Medical History:  Diagnosis Date  . Anemia    YEARS AGO  . Anxiety   . Complication of anesthesia   . GERD (gastroesophageal reflux disease)   . PONV (postoperative nausea and vomiting)    Active Problems:   Femoroacetabular impingement  Estimated body mass index is 35.96 kg/m as calculated from the following:   Height as of this  encounter: 5\' 3"  (1.6 m).   Weight as of this encounter: 92.1 kg (203 lb). Advance diet Up with therapy D/C IV fluids  Physical therapy this morning. Discharge after therapy. Diet - Regular diet Follow up - in 2 weeks Activity - WBAT Disposition - Home Condition Upon Discharge - Stable DVT Prophylaxis - Aspirin  Reche Dixon, PA-C Orthopaedic Surgery 12/04/2017, 7:27 AM

## 2017-12-04 NOTE — Evaluation (Signed)
Physical Therapy Evaluation Patient Details Name: Erica Wells MRN: [id number] DOB: 09-26-1981 Today's Date: 12/04/2017   History of Present Illness  37 y/o female s/p R hip impingment arthroscopy 2/18.  Clinical Impression  Pt in considerable pain in hip and back on arrival, also feeling nauseous but overall did well with and was able to participate with some light supine exercises (~10 minutes) though she was very weak and pain limited.  She was able to walk on 2 separate occasions with in room ambulation and then longer bout of gait training; ~50 ft + up/down 4 steps and single step with cuing and training for husband to assist as well.   Follow Up Recommendations Follow surgeon's recommendation for DC plan and follow-up therapies;Outpatient PT;Home health PT    Equipment Recommendations  Rolling walker with 5" wheels    Recommendations for Other Services       Precautions / Restrictions Precautions Precautions: Fall Required Braces or Orthoses: (hip immobilizer (on at night)) Restrictions Weight Bearing Restrictions: Yes RLE Weight Bearing: Partial weight bearing RLE Partial Weight Bearing Percentage or Pounds: 50% per MD order      Mobility  Bed Mobility Overal bed mobility: Modified Independent             General bed mobility comments: Using UEs and scooting R LE she was able to get herself slowly to EOB and sitting w/o direct assist  Transfers Overall transfer level: Modified independent Equipment used: Rolling walker (2 wheeled)             General transfer comment: Pt was able to rise w/o direct assist, clearly cautious and guarded but able to use b/l LEs with light UE use  Ambulation/Gait Ambulation/Gait assistance: Supervision Ambulation Distance (Feet): 50 Feet Assistive device: Rolling walker (2 wheeled)       General Gait Details: Pt was able to maintain PWBing with good walker use and generally did well despite feeling nauseous.  She was  relaint on the walker even more than PWBing necessitated but was safe and consistent with slow step-to cadence.  Stairs Stairs: Yes Stairs assistance: Supervision Stair Management: No rails;Backwards;With walker Number of Stairs: 4 General stair comments: Pt (with husband guarding walker) was able to negotiate up/down steps with good safety and relative confidence  Wheelchair Mobility    Modified Rankin (Stroke Patients Only)       Balance Overall balance assessment: Independent                                           Pertinent Vitals/Pain Pain Assessment: 0-10 Pain Score: 8  Pain Location: R hip, also sore back secondary to laying in bed    Home Living Family/patient expects to be discharged to:: Private residence Living Arrangements: Spouse/significant other Available Help at Discharge: Family(pt's mother will be staying with them initially)   Home Access: Stairs to enter Entrance Stairs-Rails: None Entrance Stairs-Number of Steps: 3 Home Layout: One level(single step to the living room) Home Equipment: None      Prior Function Level of Independence: Independent         Comments: Pt working, able to so all she needed     Hand Dominance        Extremity/Trunk Assessment   Upper Extremity Assessment Upper Extremity Assessment: Overall WFL for tasks assessed    Lower Extremity Assessment Lower Extremity Assessment: RLE deficits/detail  RLE Deficits / Details: Pt with very significant weakness in R hip with all movements, no SLR, only very minimal AROM with great effort       Communication   Communication: No difficulties  Cognition Arousal/Alertness: Awake/alert Behavior During Therapy: WFL for tasks assessed/performed Overall Cognitive Status: Within Functional Limits for tasks assessed                                        General Comments      Exercises Total Joint Exercises Ankle Circles/Pumps: AROM;10  reps Quad Sets: Strengthening;10 reps Gluteal Sets: Strengthening;10 reps Heel Slides: AROM;10 reps Hip ABduction/ADduction: AAROM;5 reps Straight Leg Raises: (unable)   Assessment/Plan    PT Assessment Patient needs continued PT services  PT Problem List Decreased strength;Decreased range of motion;Decreased activity tolerance;Decreased balance;Decreased mobility;Decreased coordination;Decreased knowledge of use of DME;Decreased safety awareness;Pain;Decreased knowledge of precautions       PT Treatment Interventions Gait training;DME instruction;Stair training;Functional mobility training;Therapeutic activities;Therapeutic exercise;Balance training;Neuromuscular re-education;Patient/family education    PT Goals (Current goals can be found in the Care Plan section)  Acute Rehab PT Goals Patient Stated Goal: go home  PT Goal Formulation: With patient/family Time For Goal Achievement: 12/18/17 Potential to Achieve Goals: Good    Frequency Min 2X/week   Barriers to discharge        Co-evaluation               AM-PAC PT "6 Clicks" Daily Activity  Outcome Measure Difficulty turning over in bed (including adjusting bedclothes, sheets and blankets)?: A Little Difficulty moving from lying on back to sitting on the side of the bed? : A Little Difficulty sitting down on and standing up from a chair with arms (e.g., wheelchair, bedside commode, etc,.)?: A Little Help needed moving to and from a bed to chair (including a wheelchair)?: A Little Help needed walking in hospital room?: A Little Help needed climbing 3-5 steps with a railing? : A Little 6 Click Score: 18    End of Session Equipment Utilized During Treatment: Gait belt Activity Tolerance: Patient limited by pain Patient left: with chair alarm set;with call bell/phone within reach   PT Visit Diagnosis: Muscle weakness (generalized) (M62.81);Difficulty in walking, not elsewhere classified (R26.2);Pain Pain -  Right/Left: Right Pain - part of body: Hip    Time: 0810-0908 PT Time Calculation (min) (ACUTE ONLY): 58 min   Charges:   PT Evaluation $PT Eval Low Complexity: 1 Low PT Treatments $Gait Training: 8-22 mins $Therapeutic Exercise: 8-22 mins   PT G Codes:        Kreg Shropshire, DPT 12/04/2017, 11:49 AM

## 2017-12-04 NOTE — Care Management Note (Signed)
Case Management Note  Patient Details  Name: Erica Wells MRN: [id number] Date of Birth: 10-02-1981  Subjective/Objective:                  RNCM met with patient and her husband as PT was taking her (in recliner) back to her room.  She plans to go home however, PT is recommending HHPT and a rolling walker.  They are not interested in [facility] as it is not covered under her insurance.  RNCM spoke with Dr. Posey Pronto by phone regarding this concern. Per Dr. Posey Pronto, patient will receive outpatient PT and that I can put a rolling walker order in Epic.  Action/Plan:   Patient updated that she will receive walker prior to discharge and that she will need to follow up as outpatient for PT- she agreed.   No other RNCM needs.   Expected Discharge Date:  12/04/17               Expected Discharge Plan:     In-House Referral:     Discharge planning Services  CM Consult  Post Acute Care Choice:  Durable Medical Equipment, Home Health Choice offered to:  Patient, Spouse  DME Arranged:  Walker rolling DME Agency:  [place]:  PT [place]:  [place]  Status of Service:  In process, will continue to follow  If discussed at Long Length of Stay Meetings, dates discussed:    Additional Comments:  [name], RN 12/04/2017, 9:16 AM

## 2017-12-04 NOTE — Progress Notes (Signed)
Pt in no acute distress at this time. RN explained discharge instructions and pt verbalized understanding. Pt received prescriptions and Loestrin home medication. IV removed. Pt was wheeled to visitors entrance and assisted into family members vehicle.

## 2017-12-04 NOTE — Progress Notes (Signed)
  Subjective: 1 Day Post-Op Procedure(s) (LRB): ARTHROSCOPY HIP (Right) Patient reports pain as moderate.   Patient seen in rounds with Dr. Posey Pronto. Patient is well, and has had no acute complaints or problems Plan is to go Home after hospital stay. Negative for chest pain and shortness of breath Fever: no Gastrointestinal: Negative for nausea and vomiting  Objective: Vital signs in last 24 hours: Temp:  [98 F (36.7 C)-99.2 F (37.3 C)] 98.2 F (36.8 C) (02/19 0540) Pulse Rate:  [78-100] 78 (02/19 0540) Resp:  [15-23] 16 (02/19 0023) BP: (127-144)/(76-98) 127/82 (02/19 0540) SpO2:  [96 %-100 %] 100 % (02/19 0540)  Intake/Output from previous day:  Intake/Output Summary (Last 24 hours) at 12/04/2017 0719 Last data filed at 12/03/2017 1900 Gross per 24 hour  Intake 2815 ml  Output 20 ml  Net 2795 ml    Intake/Output this shift: No intake/output data recorded.  Labs: No results for input(s): HGB in the last 72 hours. No results for input(s): WBC, RBC, HCT, PLT in the last 72 hours. No results for input(s): NA, K, CL, CO2, BUN, CREATININE, GLUCOSE, CALCIUM in the last 72 hours. No results for input(s): LABPT, INR in the last 72 hours.   EXAM General - Patient is Alert and Oriented Extremity - Sensation intact distally Dorsiflexion/Plantar flexion intact Compartment soft Dressing/Incision - clean, dry, no drainage Motor Function - intact, moving foot and toes well on exam.   Past Medical History:  Diagnosis Date  . Anemia    YEARS AGO  . Anxiety   . Complication of anesthesia   . GERD (gastroesophageal reflux disease)   . PONV (postoperative nausea and vomiting)     Assessment/Plan: 1 Day Post-Op Procedure(s) (LRB): ARTHROSCOPY HIP (Right) Active Problems:   Femoroacetabular impingement  Estimated body mass index is 35.96 kg/m as calculated from the following:   Height as of this encounter: 5\' 3"  (1.6 m).   Weight as of this encounter: 92.1 kg (203  lb). Advance diet Up with therapy D/C IV fluids  Plan to discharge home after physical therapy.  DVT Prophylaxis - Aspirin Toe-touch Weight-Bearing right leg  Reche Dixon, PA-C Orthopaedic Surgery 12/04/2017, 7:19 AM

## 2017-12-05 LAB — HIV ANTIBODY (ROUTINE TESTING W REFLEX): HIV SCREEN 4TH GENERATION: NONREACTIVE

## 2018-01-15 ENCOUNTER — Ambulatory Visit (INDEPENDENT_AMBULATORY_CARE_PROVIDER_SITE_OTHER): Payer: 59 | Admitting: Psychology

## 2018-01-15 DIAGNOSIS — F411 Generalized anxiety disorder: Secondary | ICD-10-CM | POA: Diagnosis not present

## 2018-01-29 ENCOUNTER — Ambulatory Visit (INDEPENDENT_AMBULATORY_CARE_PROVIDER_SITE_OTHER): Payer: 59 | Admitting: Psychology

## 2018-01-29 DIAGNOSIS — F411 Generalized anxiety disorder: Secondary | ICD-10-CM | POA: Diagnosis not present

## 2018-02-11 ENCOUNTER — Ambulatory Visit (INDEPENDENT_AMBULATORY_CARE_PROVIDER_SITE_OTHER): Payer: 59 | Admitting: Psychology

## 2018-02-11 DIAGNOSIS — F411 Generalized anxiety disorder: Secondary | ICD-10-CM | POA: Diagnosis not present

## 2018-02-25 ENCOUNTER — Ambulatory Visit (INDEPENDENT_AMBULATORY_CARE_PROVIDER_SITE_OTHER): Payer: 59 | Admitting: Psychology

## 2018-02-25 DIAGNOSIS — F411 Generalized anxiety disorder: Secondary | ICD-10-CM

## 2018-03-12 ENCOUNTER — Ambulatory Visit: Payer: 59 | Admitting: Psychology

## 2018-05-06 LAB — RESULTS CONSOLE HPV: CHL HPV: NEGATIVE

## 2018-05-06 LAB — HM PAP SMEAR: HM Pap smear: NEGATIVE

## 2018-07-04 IMAGING — CT CT 3D ACQUISTION [facility]KST
2 of 4 series · 17 of 46 positions shown, 19 images · non-contrast
Comparison: MRI right hip 10/23/2017.

CLINICAL DATA: [facility] protocol examination. History of
right hip pain for 4-5 months.

EXAM:
CT OF THE RIGHT HIP WITHOUT CONTRAST
TECHNIQUE: Multidetector CT imaging of the right hip was performed according to
the standard protocol. Multiplanar CT image reconstructions were
also generated.

[Series 6: coronal st · coronal · 0.79mm/px · 3 of 85 slices shown]
[im 29/85  soft-tissue]
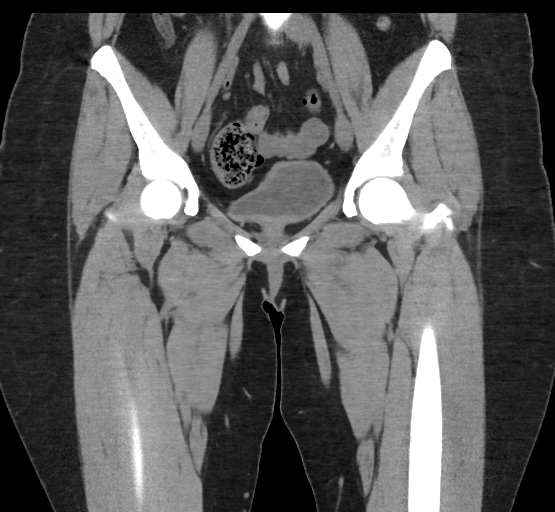
[im 38/85  soft-tissue]
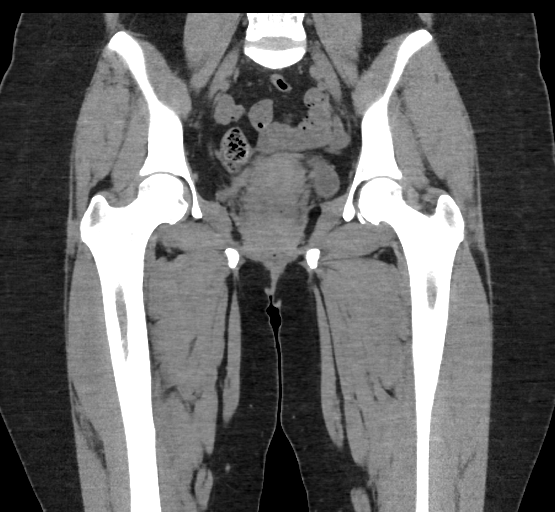
[im 47/85  soft-tissue]
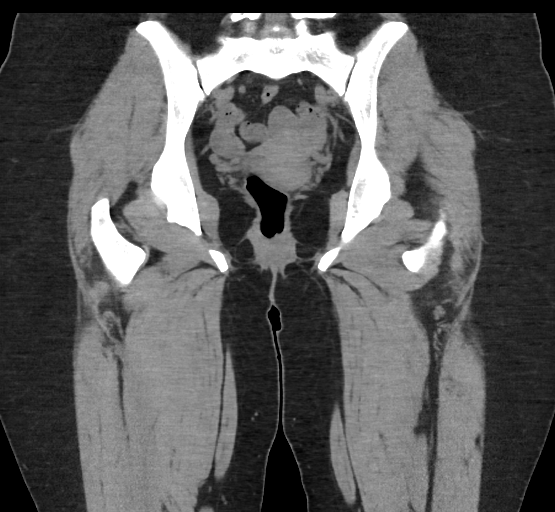

[Series 10: axial st · axial · 0.81mm/px · z∈[-914,-548]mm · 14 of 396 slices shown, 16 images]
[im 15/396  soft-tissue]
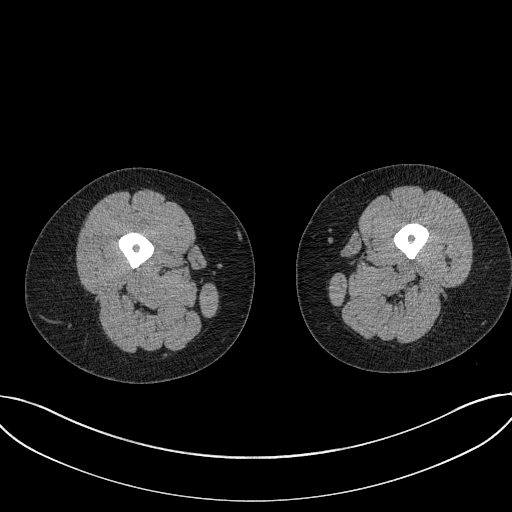
[im 15/396  bone]
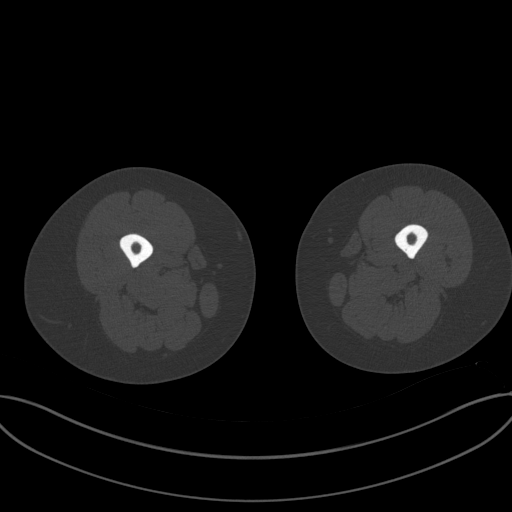
[im 44/396  soft-tissue]
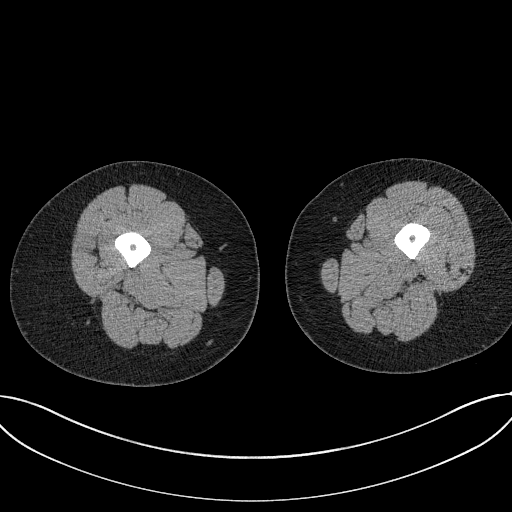
[im 74/396  soft-tissue]
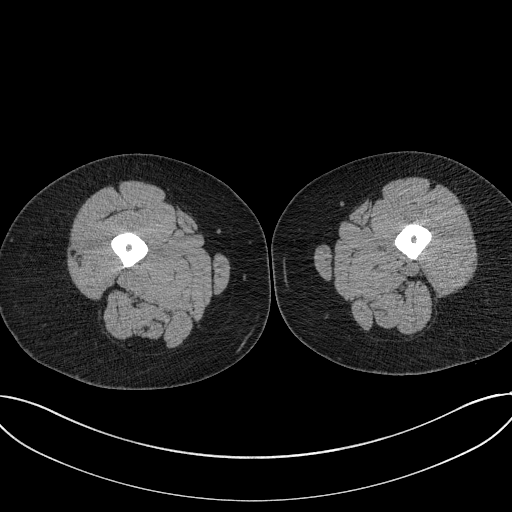
[im 103/396  soft-tissue]
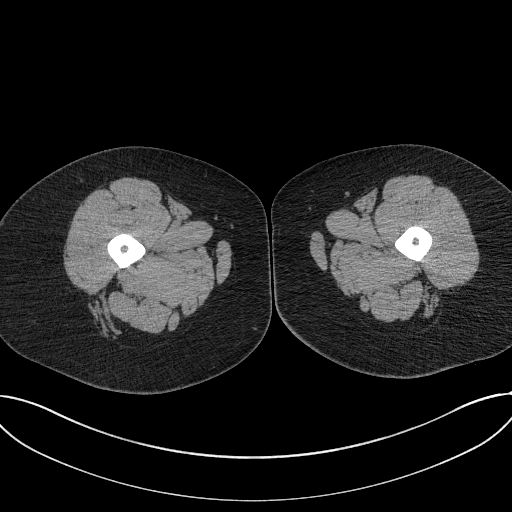
[im 132/396  soft-tissue]
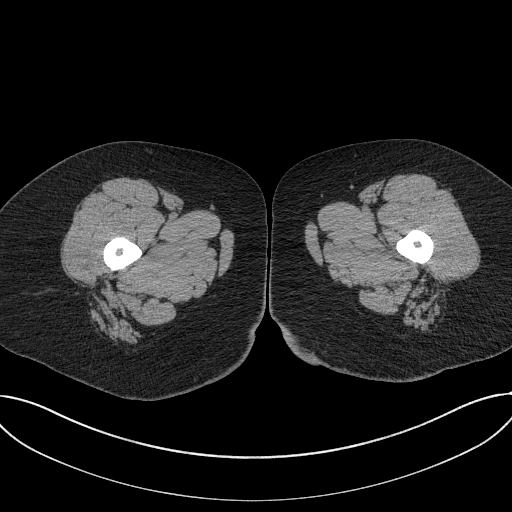
[im 161/396  soft-tissue]
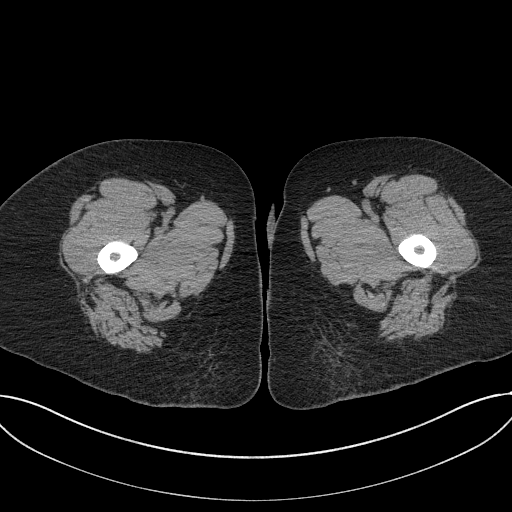
[im 191/396  soft-tissue]
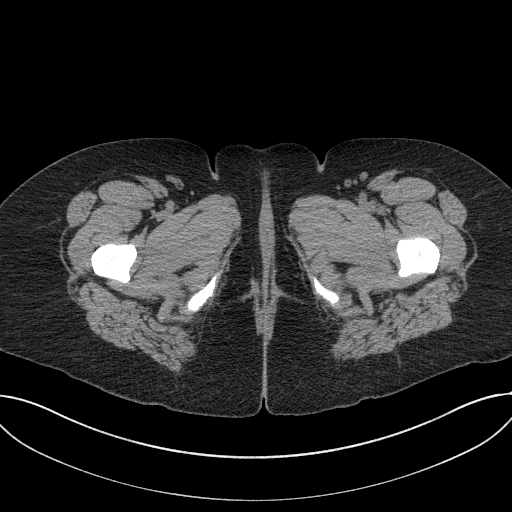
[im 205/396  soft-tissue]
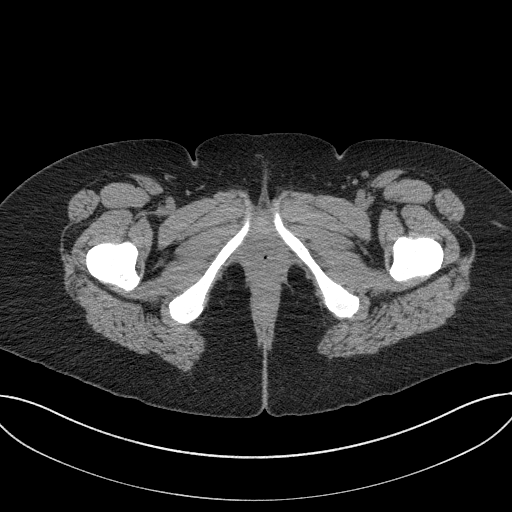
[im 235/396  soft-tissue]
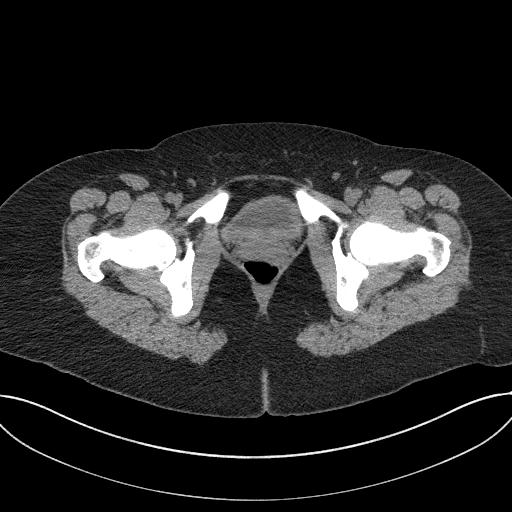
[im 235/396  bone]
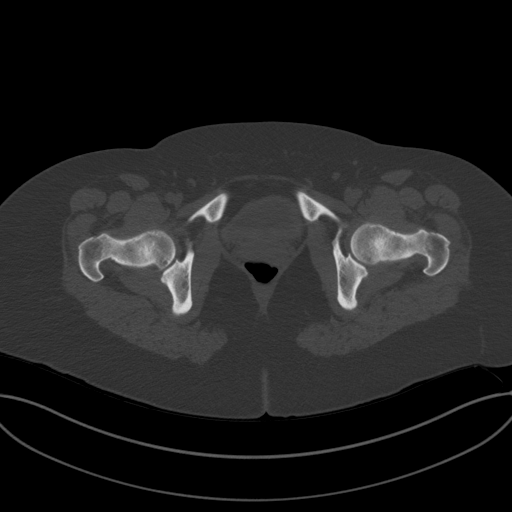
[im 264/396  soft-tissue]
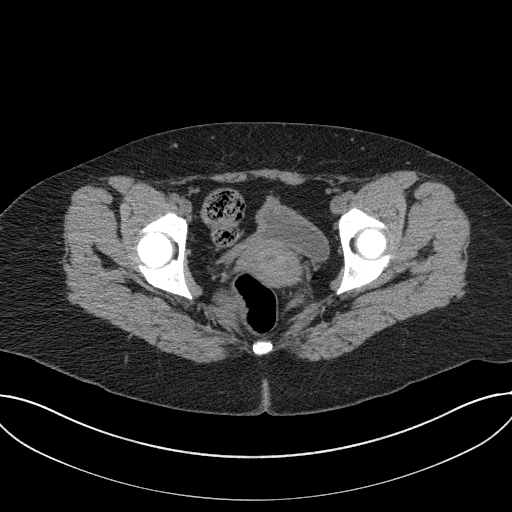
[im 293/396  soft-tissue]
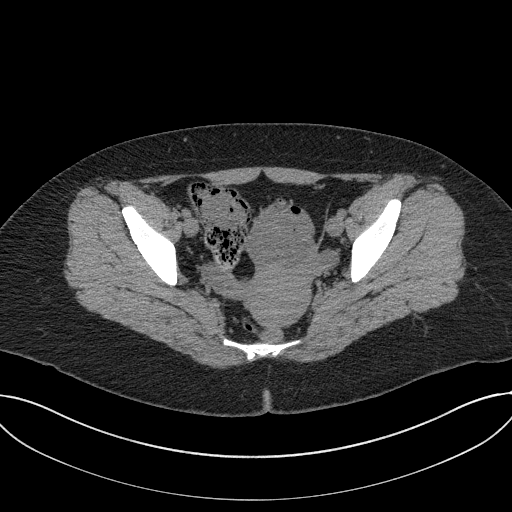
[im 322/396  soft-tissue]
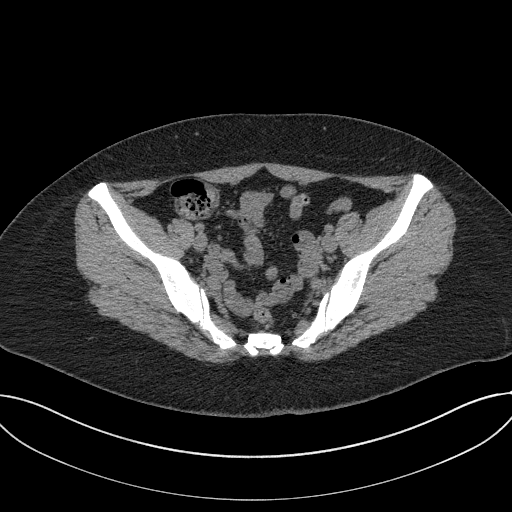
[im 352/396  soft-tissue]
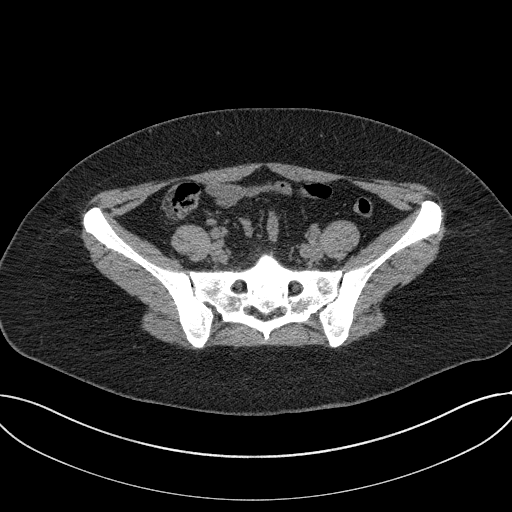
[im 381/396  soft-tissue]
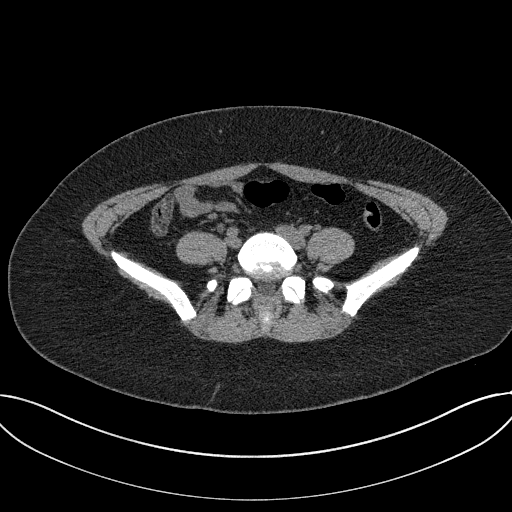

[17 of 46 positions shown; findings below may reference images not displayed]

FINDINGS: Bones/Joint/Cartilage

No fracture, dislocation or focal lesion is identified. No avascular
necrosis of the femoral heads. No subchondral cyst formation or
sclerosis about the hips.

Ligaments

Suboptimally assessed by CT.

Muscles and Tendons

Intact and normal in appearance.

Soft tissues

Imaged intrapelvic contents appear normal.
IMPRESSION: Normal examination.

3-dimensional CT images were rendered by post-processing of the
original CT data at the CT scanner. The 3-dimensional CT images were
interpreted, and findings were reported in the accompanying complete
CT report for this study.

## 2018-07-08 ENCOUNTER — Ambulatory Visit: Payer: 59 | Admitting: Psychology

## 2018-07-22 ENCOUNTER — Ambulatory Visit: Payer: 59 | Admitting: Psychology

## 2018-08-05 ENCOUNTER — Ambulatory Visit: Payer: 59 | Admitting: Psychology

## 2018-08-28 ENCOUNTER — Encounter
Admission: RE | Admit: 2018-08-28 | Discharge: 2018-08-28 | Disposition: A | Discharge status: Home / Self Care | Payer: Managed Care, Other (non HMO) | Source: Ambulatory Visit | Attending: Obstetrics and Gynecology | Admitting: Obstetrics and Gynecology

## 2018-08-28 ENCOUNTER — Other Ambulatory Visit: Payer: Self-pay

## 2018-08-28 DIAGNOSIS — Z01812 Encounter for preprocedural laboratory examination: Secondary | ICD-10-CM | POA: Diagnosis present

## 2018-08-28 LAB — TYPE AND SCREEN
ABO/RH(D): O POS
ANTIBODY SCREEN: NEGATIVE

## 2018-08-28 LAB — BASIC METABOLIC PANEL
Anion gap: 9 (ref 5–15)
BUN: 16 mg/dL (ref 6–20)
CALCIUM: 9.5 mg/dL (ref 8.9–10.3)
CO2: 26 mmol/L (ref 22–32)
CREATININE: 0.69 mg/dL (ref 0.44–1.00)
Chloride: 103 mmol/L (ref 98–111)
Glucose, Bld: 88 mg/dL (ref 70–99)
Potassium: 3.7 mmol/L (ref 3.5–5.1)
SODIUM: 138 mmol/L (ref 135–145)

## 2018-08-28 LAB — CBC
HCT: 41.9 % (ref 36.0–46.0)
Hemoglobin: 13.4 g/dL (ref 12.0–15.0)
MCH: 28.6 pg (ref 26.0–34.0)
MCHC: 32 g/dL (ref 30.0–36.0)
MCV: 89.5 fL (ref 80.0–100.0)
NRBC: 0 % (ref 0.0–0.2)
PLATELETS: 381 10*3/uL (ref 150–400)
RBC: 4.68 MIL/uL (ref 3.87–5.11)
RDW: 12.4 % (ref 11.5–15.5)
WBC: 11.4 10*3/uL — AB (ref 4.0–10.5)

## 2018-08-28 NOTE — H&P (Signed)
Erica Wells is a 37 y.o. female here for Discuss sono .  Pt is here for f/up for AUB / breakthrough bleeding on OCP .  U/s today shows: Uterus retroverted  Fibroids seen: 1)endometrial=0.65cm 2)posterior=1.7cm 3)endometrial=2cm 4)Lt posterior=0.82cm  5)Lt fundal=2cm 6)anterior=1.1cm 7)Rt mid=1cm  Endometrium=7.107mm  No free fluid seen  Lt ovary contains 3 simple follicular cysts: 8)1.1BJ 2)1.2cm 3)1.1cm  Rt ovary complex cyst with septations=1.5cm; septations=0.12 & 0.12cm  SIS:  2 endometrial polyps noted 8x5 mm and 10x10 mm  Past Medical History:  has a past medical history of ADHD, Allergic state (10/12/1984), Anxiety, Bipolar 1 disorder (CMS-HCC), GERD (gastroesophageal reflux disease), Hyperlipidemia, Vasovagal syncope (06/09/2014), and Viral rash (03/28/2018).  Past Surgical History:  has a past surgical history that includes Cholecystectomy; egd (08/07/2014); and Colonoscopy (2007 ???). Family History: family history includes Alzheimer's disease in her paternal grandmother; Breast cancer in her maternal aunt; Cancer in her father; Colon cancer in her maternal grandfather; Colon polyps in her mother; Coronary Artery Disease (Blocked arteries around heart) in her maternal grandmother; Diabetes type II in her father; Glaucoma in her maternal grandmother; High blood pressure (Hypertension) in her mother; Hyperlipidemia (Elevated cholesterol) in her brother, mother, and sister; Skin cancer in her maternal aunt. Social History:  reports that she has never smoked. She has never used smokeless tobacco. She reports current alcohol use. She reports that she does not use drugs. OB/GYN History:          OB History    Gravida  0   Para  0   Term  0   Preterm  0   AB  0   Living  0     SAB  0   TAB  0   Ectopic  0   Molar  0   Multiple  0   Live Births  0          Allergies: is allergic to penicillins and buspar  [buspirone]. Medications:  Current Outpatient Medications:  .  amitriptyline (ELAVIL) 10 MG tablet, TAKE 1 TO 3 TABLETS BY  MOUTH NIGHTLY AS NEEDED FOR SLEEP, Disp: 90 tablet, Rfl: 2 .  clindamycin-benzoyl peroxide (BENZACLIN) 1-5 % topical gel, Apply topically 2 (two) times daily., Disp: 100 g, Rfl: 0 .  clonazePAM (KLONOPIN) 1 MG tablet, Take 1 po bid for anxiety, Disp: 180 tablet, Rfl: 0 .  colestipol (COLESTID) 1 gram tablet, Take 2 tablets (2 g total) by mouth 2 (two) times daily, Disp: 360 tablet, Rfl: 0 .  docusate (COLACE) 100 MG capsule, Take 100 mg by mouth 2 (two) times daily as needed for Constipation, Disp: , Rfl:  .  lamoTRIgine (LAMICTAL) 25 MG tablet, TAKE  2 TABS PO DAILY, Disp: 180 tablet, Rfl: 0 .  lansoprazole (PREVACID) 30 MG DR capsule, Take 1 capsule (30 mg total) by mouth once daily, Disp: 90 capsule, Rfl: 3 .  norgestrel-ethinyl estradiol (LO/OVRAL,LOW-OGESTROL) 0.3-30 mg-mcg tablet, Take 1 tablet by mouth once daily, Disp: 1 Package, Rfl: 11 .  ranitidine (ZANTAC) 300 MG capsule, Take 1 capsule (300 mg total) by mouth nightly, Disp: 90 capsule, Rfl: 3 .  sertraline (ZOLOFT) 100 MG tablet, Take 1 tablet (100 mg total) by mouth once daily, Disp: 90 tablet, Rfl: 3 .  dextroamphetamine-amphetamine (ADDERALL XR) 20 MG XR capsule, Take 1 capsule (20 mg total) by mouth once daily, Disp: 90 capsule, Rfl: 0  Review of Systems: General:  No fatigue or weight loss Eyes:                           No vision changes Ears:                            No hearing difficulty Respiratory:                No cough or shortness of breath Pulmonary:                  No asthma or shortness of breath Cardiovascular:           No chest pain, palpitations, dyspnea on exertion Gastrointestinal:          No abdominal bloating, chronic diarrhea, constipations, masses, pain or hematochezia Genitourinary:             No hematuria, dysuria, abnormal vaginal discharge, pelvic  pain, Menometrorrhagia Lymphatic:                   No swollen lymph nodes Musculoskeletal:         No muscle weakness Neurologic:                  No extremity weakness, syncope, seizure disorder Psychiatric:                  No history of depression, delusions or suicidal/homicidal ideation    Exam:      Vitals:   08/12/18 1123  BP: 112/80  Pulse: 76    Body mass index is 30.72 kg/m.  WDWN white/  female in NAD   Lungs: CTA  CV : RRR without murmur   Abdomen: soft , no mass, normal active bowel sounds,  non-tender, no rebound tenderness Pelvic: tanner stage 5 ,  External genitalia: vulva /labia no lesions Urethra: no prolapse Vagina: normal physiologic d/c Cervix: no lesions, no cervical motion tenderness   Uterus: normal size shape and contour, non-tender Adnexa: no mass,  non-tender   Rectovaginal: no mass heme negative Saline infusion sonohysterography: betadine prep to the cervix followed by placement of the HSG catheter into the endometrial canal . Sterile H2O is injected while performing a transvaginal u/s . Findings: as above  Impression:   The primary encounter diagnosis was Abnormal uterine bleeding (AUB), unspecified. Diagnoses of Fibroids, intramural and Endometrial polyp were also pertinent to this visit.    Plan:   Fractional D+C , H/S and myosure resection in OR   pt has been counseled regarding the risks , including organ injury and additional surgery                                    .  Caroline Sauger, MD       Electronically signed by Caroline Sauger, MD on 08/14/2018 11:54 AM

## 2018-08-28 NOTE — Patient Instructions (Signed)
Your procedure is scheduled on: Friday 09/06/18 Report to [facility]. To find out your arrival time please call [phone] between 1PM - 3PM on Thursday 09/05/18.  Remember: Instructions that are not followed completely may result in serious medical risk, up to and including death, or upon the discretion of your surgeon and anesthesiologist your surgery may need to be rescheduled.     _X__ 1. Do not eat food after midnight the night before your procedure.                 No gum chewing or hard candies. You may drink clear liquids up to 2 hours                 before you are scheduled to arrive for your surgery- DO not drink clear                 liquids within 2 hours of the start of your surgery.                 Clear Liquids include:  water, apple juice without pulp, clear carbohydrate                 drink such as Clearfast or Gatorade, Black Coffee or Tea (Do not add                 anything to coffee or tea).  __X__2.  On the morning of surgery brush your teeth with toothpaste and water, you                 may rinse your mouth with mouthwash if you wish.  Do not swallow any              toothpaste of mouthwash.     _X__ 3.  No Alcohol for 24 hours before or after surgery.   _X__ 4.  Do Not Smoke or use e-cigarettes For 24 Hours Prior to Your Surgery.                 Do not use any chewable tobacco products for at least 6 hours prior to                 surgery.  ____  5.  Bring all medications with you on the day of surgery if instructed.   __X__  6.  Notify your doctor if there is any change in your medical condition      (cold, fever, infections).     Do not wear jewelry, make-up, hairpins, clips or nail polish. Do not wear lotions, powders, or perfumes.  Do not shave 48 hours prior to surgery. Men may shave face and neck. Do not bring valuables to the hospital.    [facility] is not responsible for any belongings or  valuables.  Contacts, dentures/partials or body piercings may not be worn into surgery. Bring a case for your contacts, glasses or hearing aids, a denture cup will be supplied. Leave your suitcase in the car. After surgery it may be brought to your room. For patients admitted to the hospital, discharge time is determined by your treatment team.   Patients discharged the day of surgery will not be allowed to drive home.   Please read over the following fact sheets that you were given:   MRSA Information  __X__ Take these medicines the morning of surgery with A SIP OF WATER:  1. lansoprazole (PREVACID)  2. ranitidine (ZANTAC)  3.   4. clonazePAM (KLONOPIN) if needed  5.  6.  ____ Fleet Enema (as directed)   __X__ Use CHG Soap/SAGE wipes as directed  ____ Use inhalers on the day of surgery  ____ Stop metformin/Janumet/Farxiga 2 days prior to surgery    ____ Take 1/2 of usual insulin dose the night before surgery. No insulin the morning          of surgery.   ____ Stop Blood Thinners Coumadin/Plavix/Xarelto/Pleta/Pradaxa/Eliquis/Effient/Aspirin  on   Or contact your Surgeon, Cardiologist or Medical Doctor regarding  ability to stop your blood thinners  __X__ Stop Anti-inflammatories 7 days before surgery such as Advil, Ibuprofen, Motrin,  BC or Goodies Powder, Naprosyn, Naproxen, Aleve, Aspirin You may take Tylenol   __X__ Stop all herbal supplements, fish oil or vitamin E until after surgery.    ____ Bring C-Pap to the hospital.

## 2018-09-06 ENCOUNTER — Encounter
Admission: RE | Disposition: A | Discharge status: Home / Self Care | Payer: Self-pay | Source: Ambulatory Visit | Attending: Obstetrics and Gynecology

## 2018-09-06 ENCOUNTER — Ambulatory Visit
Admission: RE | Admit: 2018-09-06 | Discharge: 2018-09-06 | Disposition: A | Discharge status: Home / Self Care | Payer: Managed Care, Other (non HMO) | Source: Ambulatory Visit | Attending: Obstetrics and Gynecology | Admitting: Obstetrics and Gynecology

## 2018-09-06 ENCOUNTER — Ambulatory Visit: Payer: Managed Care, Other (non HMO) | Admitting: Anesthesiology

## 2018-09-06 ENCOUNTER — Encounter: Payer: Self-pay | Admitting: *Deleted

## 2018-09-06 ENCOUNTER — Other Ambulatory Visit: Payer: Self-pay

## 2018-09-06 DIAGNOSIS — D250 Submucous leiomyoma of uterus: Secondary | ICD-10-CM | POA: Diagnosis not present

## 2018-09-06 DIAGNOSIS — D251 Intramural leiomyoma of uterus: Secondary | ICD-10-CM | POA: Insufficient documentation

## 2018-09-06 DIAGNOSIS — Z880 Allergy status to penicillin: Secondary | ICD-10-CM | POA: Diagnosis not present

## 2018-09-06 DIAGNOSIS — Z79899 Other long term (current) drug therapy: Secondary | ICD-10-CM | POA: Diagnosis not present

## 2018-09-06 DIAGNOSIS — N840 Polyp of corpus uteri: Secondary | ICD-10-CM | POA: Diagnosis not present

## 2018-09-06 DIAGNOSIS — F319 Bipolar disorder, unspecified: Secondary | ICD-10-CM | POA: Insufficient documentation

## 2018-09-06 DIAGNOSIS — F419 Anxiety disorder, unspecified: Secondary | ICD-10-CM | POA: Insufficient documentation

## 2018-09-06 DIAGNOSIS — F909 Attention-deficit hyperactivity disorder, unspecified type: Secondary | ICD-10-CM | POA: Diagnosis not present

## 2018-09-06 DIAGNOSIS — K219 Gastro-esophageal reflux disease without esophagitis: Secondary | ICD-10-CM | POA: Insufficient documentation

## 2018-09-06 DIAGNOSIS — E785 Hyperlipidemia, unspecified: Secondary | ICD-10-CM | POA: Insufficient documentation

## 2018-09-06 DIAGNOSIS — N939 Abnormal uterine and vaginal bleeding, unspecified: Secondary | ICD-10-CM | POA: Diagnosis not present

## 2018-09-06 HISTORY — PX: DILATATION & CURETTAGE/HYSTEROSCOPY WITH MYOSURE: SHX6511

## 2018-09-06 LAB — POCT PREGNANCY, URINE: Preg Test, Ur: NEGATIVE

## 2018-09-06 LAB — ABO/RH: ABO/RH(D): O POS

## 2018-09-06 SURGERY — DILATATION & CURETTAGE/HYSTEROSCOPY WITH MYOSURE
Anesthesia: General

## 2018-09-06 MED ORDER — FENTANYL CITRATE (PF) 100 MCG/2ML IJ SOLN
INTRAMUSCULAR | Status: DC | PRN
  Administered 2018-09-06 (×2): 50 ug via INTRAVENOUS

## 2018-09-06 MED ORDER — DEXAMETHASONE SODIUM PHOSPHATE 10 MG/ML IJ SOLN
INTRAMUSCULAR | Status: DC | PRN
  Administered 2018-09-06: 10 mg via INTRAVENOUS

## 2018-09-06 MED ORDER — SCOPOLAMINE 1 MG/3DAYS TD PT72
MEDICATED_PATCH | TRANSDERMAL | Status: AC
  Filled 2018-09-06: qty 1

## 2018-09-06 MED ORDER — HYDROMORPHONE HCL 1 MG/ML IJ SOLN
INTRAMUSCULAR | Status: AC
  Administered 2018-09-06: 0.25 mg via INTRAVENOUS
  Filled 2018-09-06: qty 1

## 2018-09-06 MED ORDER — PROPOFOL 10 MG/ML IV BOLUS
INTRAVENOUS | Status: AC
  Filled 2018-09-06: qty 20

## 2018-09-06 MED ORDER — ONDANSETRON HCL 4 MG/2ML IJ SOLN
INTRAMUSCULAR | Status: AC
  Filled 2018-09-06: qty 2

## 2018-09-06 MED ORDER — CLINDAMYCIN PHOSPHATE 900 MG/50ML IV SOLN
INTRAVENOUS | Status: DC | PRN
  Administered 2018-09-06: 900 mg via INTRAVENOUS

## 2018-09-06 MED ORDER — PROMETHAZINE HCL 25 MG/ML IJ SOLN
INTRAMUSCULAR | Status: AC
  Filled 2018-09-06: qty 1

## 2018-09-06 MED ORDER — ACETAMINOPHEN 10 MG/ML IV SOLN
INTRAVENOUS | Status: AC
  Filled 2018-09-06: qty 100

## 2018-09-06 MED ORDER — SEVOFLURANE IN SOLN
RESPIRATORY_TRACT | Status: AC
  Filled 2018-09-06: qty 250

## 2018-09-06 MED ORDER — CLINDAMYCIN PHOSPHATE 900 MG/50ML IV SOLN
INTRAVENOUS | Status: AC
  Filled 2018-09-06: qty 50

## 2018-09-06 MED ORDER — FENTANYL CITRATE (PF) 100 MCG/2ML IJ SOLN
INTRAMUSCULAR | Status: AC
  Filled 2018-09-06: qty 2

## 2018-09-06 MED ORDER — HYDROMORPHONE HCL 1 MG/ML IJ SOLN
0.2500 mg | INTRAMUSCULAR | Status: AC | PRN
  Administered 2018-09-06 (×8): 0.25 mg via INTRAVENOUS

## 2018-09-06 MED ORDER — ACETAMINOPHEN 10 MG/ML IV SOLN
INTRAVENOUS | Status: DC | PRN
  Administered 2018-09-06: 1000 mg via INTRAVENOUS

## 2018-09-06 MED ORDER — LIDOCAINE HCL (PF) 2 % IJ SOLN
INTRAMUSCULAR | Status: AC
  Filled 2018-09-06: qty 10

## 2018-09-06 MED ORDER — LACTATED RINGERS IV SOLN
INTRAVENOUS | Status: DC
  Administered 2018-09-06: 14:00:00 via INTRAVENOUS

## 2018-09-06 MED ORDER — DEXAMETHASONE SODIUM PHOSPHATE 10 MG/ML IJ SOLN
INTRAMUSCULAR | Status: AC
  Filled 2018-09-06: qty 1

## 2018-09-06 MED ORDER — MIDAZOLAM HCL 2 MG/2ML IJ SOLN
INTRAMUSCULAR | Status: DC | PRN
  Administered 2018-09-06: 2 mg via INTRAVENOUS

## 2018-09-06 MED ORDER — SCOPOLAMINE 1 MG/3DAYS TD PT72
1.0000 | MEDICATED_PATCH | TRANSDERMAL | Status: DC
  Administered 2018-09-06: 1.5 mg via TRANSDERMAL

## 2018-09-06 MED ORDER — PROPOFOL 10 MG/ML IV BOLUS
INTRAVENOUS | Status: DC | PRN
  Administered 2018-09-06: 200 mg via INTRAVENOUS

## 2018-09-06 MED ORDER — SILVER NITRATE-POT NITRATE 75-25 % EX MISC
CUTANEOUS | Status: DC | PRN
  Administered 2018-09-06: 4
  Administered 2018-09-06: 1

## 2018-09-06 MED ORDER — LIDOCAINE HCL (CARDIAC) PF 100 MG/5ML IV SOSY
PREFILLED_SYRINGE | INTRAVENOUS | Status: DC | PRN
  Administered 2018-09-06: 100 mg via INTRAVENOUS

## 2018-09-06 MED ORDER — PROMETHAZINE HCL 25 MG/ML IJ SOLN: 12.5000 mg | INTRAMUSCULAR | Status: DC | PRN

## 2018-09-06 MED ORDER — GENTAMICIN SULFATE 40 MG/ML IJ SOLN
INTRAVENOUS | Status: AC
  Administered 2018-09-06: 380 mg via INTRAVENOUS
  Filled 2018-09-06: qty 9.5

## 2018-09-06 MED ORDER — ONDANSETRON HCL 4 MG/2ML IJ SOLN
INTRAMUSCULAR | Status: DC | PRN
  Administered 2018-09-06: 4 mg via INTRAVENOUS

## 2018-09-06 MED ORDER — MIDAZOLAM HCL 2 MG/2ML IJ SOLN
INTRAMUSCULAR | Status: AC
  Filled 2018-09-06: qty 2

## 2018-09-06 MED ORDER — SODIUM CHLORIDE FLUSH 0.9 % IV SOLN
INTRAVENOUS | Status: AC
  Filled 2018-09-06: qty 10

## 2018-09-06 SURGICAL SUPPLY — 24 items
CANISTER SUC SOCK COL 7IN (MISCELLANEOUS) ×2 IMPLANT
CANISTER SUCT 3000ML PPV (MISCELLANEOUS) ×2 IMPLANT
CATH ROBINSON RED A/P 16FR (CATHETERS) ×2 IMPLANT
COVER WAND RF STERILE (DRAPES) ×2 IMPLANT
DEVICE MYOSURE LITE (MISCELLANEOUS) IMPLANT
DEVICE MYOSURE REACH (MISCELLANEOUS) ×2 IMPLANT
FLUENT management system ×2 IMPLANT
GLOVE BIO SURGEON STRL SZ8 (GLOVE) ×2 IMPLANT
GOWN STRL REUS W/ TWL LRG LVL3 (GOWN DISPOSABLE) ×1 IMPLANT
GOWN STRL REUS W/ TWL XL LVL3 (GOWN DISPOSABLE) ×1 IMPLANT
GOWN STRL REUS W/TWL LRG LVL3 (GOWN DISPOSABLE) ×1
GOWN STRL REUS W/TWL XL LVL3 (GOWN DISPOSABLE) ×1
KIT PROCEDURE FLUENT (KITS) ×2 IMPLANT
KIT TURNOVER CYSTO (KITS) ×2 IMPLANT
PACK DNC HYST (MISCELLANEOUS) ×2 IMPLANT
PAD OB MATERNITY 4.3X12.25 (PERSONAL CARE ITEMS) ×2 IMPLANT
PAD PREP 24X41 OB/GYN DISP (PERSONAL CARE ITEMS) ×2 IMPLANT
SOL .9 NS 3000ML IRR  AL (IV SOLUTION) ×1
SOL .9 NS 3000ML IRR UROMATIC (IV SOLUTION) ×1 IMPLANT
SUT VIC AB 2-0 SH 27 (SUTURE) ×1
SUT VIC AB 2-0 SH 27XBRD (SUTURE) ×1 IMPLANT
TOWEL OR 17X26 4PK STRL BLUE (TOWEL DISPOSABLE) ×2 IMPLANT
TUBING CONNECTING 10 (TUBING) ×2 IMPLANT
TUBING HYSTEROSCOPY DOLPHIN (MISCELLANEOUS) ×2 IMPLANT

## 2018-09-06 NOTE — Discharge Instructions (Signed)
Dilation and Curettage or Vacuum Curettage, Care After These instructions give you information about caring for yourself after your procedure. Your doctor may also give you more specific instructions. Call your doctor if you have any problems or questions after your procedure. Follow these instructions at home: Activity  Do not drive or use heavy machinery while taking prescription pain medicine.  For 24 hours after your procedure, avoid driving.  Take short walks often, followed by rest periods. Ask your doctor what activities are safe for you. After one or two days, you may be able to return to your normal activities.  Do not lift anything that is heavier than 10 lb (4.5 kg) until your doctor approves.  For at least 2 weeks, or as long as told by your doctor: ? Do not douche. ? Do not use tampons. ? Do not have sex. General instructions  Take over-the-counter and prescription medicines only as told by your doctor. This is very important if you take blood thinning medicine.  Do not take baths, swim, or use a hot tub until your doctor approves. Take showers instead of baths.  Wear compression stockings as told by your doctor.  It is up to you to get the results of your procedure. Ask your doctor when your results will be ready.  Keep all follow-up visits as told by your doctor. This is important. Contact a doctor if:  You have very bad cramps that get worse or do not get better with medicine.  You have very bad pain in your belly (abdomen).  You cannot drink fluids without throwing up (vomiting).  You get pain in a different part of the area between your belly and thighs (pelvis).  You have bad-smelling discharge from your vagina.  You have a rash. Get help right away if:  You are bleeding a lot from your vagina. A lot of bleeding means soaking more than one sanitary pad in an hour, for 2 hours in a row.  You have clumps of blood (blood clots) coming from  your vagina.  You have a fever or chills.  Your belly feels very tender or hard.  You have chest pain.  You have trouble breathing.  You cough up blood.  You feel dizzy.  You feel light-headed.  You pass out (faint).  You have pain in your neck or shoulder area. Summary  Take short walks often, followed by rest periods. Ask your doctor what activities are safe for you. After one or two days, you may be able to return to your normal activities.  Do not lift anything that is heavier than 10 lb (4.5 kg) until your doctor approves.  Do not take baths, swim, or use a hot tub until your doctor approves. Take showers instead of baths.  Contact your doctor if you have any symptoms of infection, like bad-smelling discharge from your vagina. This information is not intended to replace advice given to you by your health care provider. Make sure you discuss any questions you have with your health care provider. Document Released: 07/11/2008 Document Revised: 06/19/2016 Document Reviewed: 06/19/2016 Elsevier Interactive Patient Education  2017 [place]   1) The drugs that you were given will stay in your system until tomorrow so for the next 24 hours you should not:  A) Drive an automobile B) Make any legal decisions C) Drink any alcoholic beverage   2) You may resume regular meals tomorrow.  Today it is better to start with liquids and gradually work up to solid foods.  You may eat anything you prefer, but it is better to start with liquids, then soup and crackers, and gradually work up to solid foods.   3) Please notify your doctor immediately if you have any unusual bleeding, trouble breathing, redness and pain at the surgery site, drainage, fever, or pain not relieved by medication. 4)   5) Your post-operative visit with Dr.                                     is: Date:                        Time:    Please call to  schedule your post-operative visit.  6) Additional Instructions:

## 2018-09-06 NOTE — Anesthesia Postprocedure Evaluation (Signed)
Anesthesia Post Note  Patient: Maribell Demeo Pickar  Procedure(s) Performed: DILATATION & CURETTAGE/HYSTEROSCOPY WITH MYOSURE-resection of polyps,submucosal polyps (N/A )  Patient location during evaluation: PACU Anesthesia Type: General Level of consciousness: awake and alert Pain management: pain level controlled Vital Signs Assessment: post-procedure vital signs reviewed and stable Respiratory status: spontaneous breathing, nonlabored ventilation, respiratory function stable and patient connected to nasal cannula oxygen Cardiovascular status: blood pressure returned to baseline and stable Postop Assessment: no apparent nausea or vomiting Anesthetic complications: no     Last Vitals:  Vitals:   09/06/18 1907 09/06/18 1915  BP: 128/85 124/84  Pulse: 80 81  Resp: 18 15  Temp:  36.8 C  SpO2: 99% 99%    Last Pain:  Vitals:   09/06/18 1915  TempSrc:   PainSc: 3                  Precious Haws 

## 2018-09-06 NOTE — Anesthesia Preprocedure Evaluation (Addendum)
Anesthesia Evaluation  Patient identified by MRN, date of birth, ID band Patient awake    Reviewed: Allergy & Precautions, H&P , NPO status , Patient's Chart, lab work & pertinent test results  History of Anesthesia Complications (+) PONV and history of anesthetic complications  Airway Mallampati: II  TM Distance: >3 FB     Dental  (+) Teeth Intact   Pulmonary neg pulmonary ROS,           Cardiovascular negative cardio ROS       Neuro/Psych Anxiety negative neurological ROS  negative psych ROS   GI/Hepatic negative GI ROS, Neg liver ROS, GERD  ,  Endo/Other  negative endocrine ROS  Renal/GU      Musculoskeletal   Abdominal   Peds  Hematology negative hematology ROS (+) Blood dyscrasia, anemia ,   Anesthesia Other Findings Past Medical History: No date: Anemia     Comment:  YEARS AGO No date: Anxiety No date: Complication of anesthesia No date: GERD (gastroesophageal reflux disease) No date: PONV (postoperative nausea and vomiting)  Past Surgical History: 2014: CHOLECYSTECTOMY 12/03/2017: HIP ARTHROSCOPY; Right     Comment:  Procedure: ARTHROSCOPY HIP;  Surgeon: Leim Fabry, MD;               Location: [facility] ORS;  Service: Orthopedics;  Laterality:               Right;     Reproductive/Obstetrics negative OB ROS                            Anesthesia Physical Anesthesia Plan  ASA: II  Anesthesia Plan: General LMA   Post-op Pain Management:    Induction:   PONV Risk Score and Plan: 4 or greater and Dexamethasone, Ondansetron, Midazolam and Scopolamine patch - Pre-op  Airway Management Planned:   Additional Equipment:   Intra-op Plan:   Post-operative Plan:   Informed Consent: I have reviewed the patients History and Physical, chart, labs and discussed the procedure including the risks, benefits and alternatives for the proposed anesthesia with the patient or  authorized representative who has indicated his/her understanding and acceptance.   Dental Advisory Given  Plan Discussed with: Anesthesiologist  Anesthesia Plan Comments:        Anesthesia Quick Evaluation

## 2018-09-06 NOTE — Anesthesia Post-op Follow-up Note (Signed)
Anesthesia QCDR form completed.        

## 2018-09-06 NOTE — Brief Op Note (Signed)
09/06/2018  6:10 PM  PATIENT:  Mila Pair Headlee  37 y.o. female  PRE-OPERATIVE DIAGNOSIS:  AUB, Endometrial polyps  POST-OPERATIVE DIAGNOSIS:  AUB, Endometrial polyps  submucosal fibroids   PROCEDURE:  Procedure(s): DILATATION & CURETTAGE/HYSTEROSCOPY WITH MYOSURE-resection of polyps,submucosal polyps (N/A) and fibroids  SURGEON:  Surgeon(s) and Role:    * Candies Palm, Gwen Her, MD - Primary  PHYSICIAN ASSISTANT: scrub tech   ASSISTANTS: none   ANESTHESIA:   general  EBL:  10 mL   BLOOD ADMINISTERED:none  DRAINS: none   LOCAL MEDICATIONS USED:  NONE  SPECIMEN:  Source of Specimen:  ecc, endometrial polyps and fragments of sudmucosal fibroids , endometrial curettings   DISPOSITION OF SPECIMEN:  PATHOLOGY  COUNTS:  YES  TOURNIQUET:  * No tourniquets in log *  DICTATION: .Other Dictation: Dictation Number verbal  PLAN OF CARE: Discharge to home after PACU  PATIENT DISPOSITION:  PACU - hemodynamically stable.   Delay start of Pharmacological VTE agent (>24hrs) due to surgical blood loss or risk of bleeding: not applicable

## 2018-09-06 NOTE — Progress Notes (Signed)
Ready for surgery . Neg HCG. All questions answered

## 2018-09-06 NOTE — Anesthesia Procedure Notes (Signed)
Procedure Name: LMA Insertion Date/Time: 09/06/2018 5:18 PM Performed by: Johnna Acosta, CRNA Pre-anesthesia Checklist: Patient identified, Emergency Drugs available, Suction available, Patient being monitored and Timeout performed Patient Re-evaluated:Patient Re-evaluated prior to induction Oxygen Delivery Method: Circle system utilized Preoxygenation: Pre-oxygenation with 100% oxygen Induction Type: IV induction LMA: LMA inserted LMA Size: 4.0 Tube type: Oral Number of attempts: 1 Placement Confirmation: positive ETCO2 and breath sounds checked- equal and bilateral Tube secured with: Tape Dental Injury: Teeth and Oropharynx as per pre-operative assessment

## 2018-09-06 NOTE — Transfer of Care (Signed)
Immediate Anesthesia Transfer of Care Note  Patient: Erica Wells  Procedure(s) Performed: DILATATION & CURETTAGE/HYSTEROSCOPY WITH MYOSURE-resection of polyps,submucosal polyps (N/A )  Patient Location: PACU  Anesthesia Type:General  Level of Consciousness: drowsy  Airway & Oxygen Therapy: Patient Spontanous Breathing and Patient connected to face mask oxygen  Post-op Assessment: Report given to RN and Post -op Vital signs reviewed and stable  Post vital signs: Reviewed and stable  Last Vitals:  Vitals Value Taken Time  BP 133/88 09/06/2018  6:22 PM  Temp 36.2 C 09/06/2018  6:22 PM  Pulse 82 09/06/2018  6:23 PM  Resp 18 09/06/2018  6:23 PM  SpO2 100 % 09/06/2018  6:23 PM  Vitals shown include unvalidated device data.  Last Pain:  Vitals:   09/06/18 1822  TempSrc: Temporal  PainSc:          Complications: No apparent anesthesia complications

## 2018-09-07 NOTE — Op Note (Signed)
NAME: Erica Wells, Erica Wells MEDICAL RECORD OM:[id number] ACCOUNT [id number] DATE OF BIRTH:1981/01/27 FACILITY: [facility] LOCATION: [facility] PHYSICIAN:Reford Olliff Josefine Class, MD  OPERATIVE REPORT  DATE OF PROCEDURE:  09/06/2018  PREOPERATIVE DIAGNOSES: 1.  Abnormal uterine bleeding. 2.  Endometrial polyps.  POSTOPERATIVE DIAGNOSES: 1.  Abnormal uterine bleeding. 2.  Endometrial polyp. 3.  Submucosal fibroids.  ANESTHESIA:  General endotracheal anesthesia.  SURGEON:  Laverta Baltimore, MD  FIRST ASSISTANT:  Scrub tech.  PROCEDURE: 1.  Fractional dilation and curettage. 2.  Hysteroscopic removal of endometrial polyp and endometrial submucosal fibroids with MyoSure.  INDICATIONS:  A 37 year old gravida 0 patient with a several-month history with abnormal uterine bleeding and breakthrough bleeding on birth control pills on ultrasound.  The patient demonstrates fibroids as well as endometrial defects consistent with  endometrial polyps.  FINDINGS:  The patient had a 1 cm x 1 cm endometrial polyp at the right opening to the right fallopian tube.  The patient had 2 submucosal fibroids on the posterior aspect of the uterus, 1 x 1 cm and a larger pedunculated one at the posterior lower  uterine segment approximately 1.5 cm x 1 cm.  DESCRIPTION OF PROCEDURE:  After adequate general endotracheal anesthesia, the patient was placed in dorsal supine position with the legs in the candy cane stirrups.  Timeout was performed.  The patient did receive prophylaxis with gentamicin and  clindamycin.  A weighted speculum was placed in the posterior vaginal vault.  The bladder was drained yielding 100 mL of dark urine.  The anterior cervix was grasped with a single-tooth tenaculum.  An endocervical curettage was performed followed by  uterine sounding to 9 cm.  Cervix was dilated to #15 Hank dilator.  Uterus was noted to be in the retroverted position.  The hysteroscope was advanced into the endometrial  cavity without difficulty.  Initial evaluation revealed 2 submucosal fibroids in  the posterior aspect of the uterus, 1 at the lower uterine segment and 1 in the midportion of the uterus, and there was a polyp in the right fallopian tube ostia.  This one measured 1 x 1 cm.  MyoSure was brought up to the operative field, and resection  of the endometrial polyp and submucosal fibroids occurred.  Pictures were taken.  Good hemostasis was noted.  Net deficit of fluid 1215 mL.  Tenaculum was removed from the anterior cervix.  There was some bleeding on the anterior cervix, and a 2-0 Vicryl  suture was used to secure the bleeding.  Good hemostasis was noted.  ESTIMATED BLOOD LOSS:  10 mL.  URINE OUTPUT:  100 mL.  INTRAOPERATIVE FLUIDS:  600 mL.  The patient did receive intravenous Tylenol at the end of the case.  COMPLICATIONS:  None.  DISPOSITION:  The patient was taken to recovery room in good condition.  LN/NUANCE  D:09/06/2018 T:09/07/2018 JOB:[id number]

## 2018-09-08 ENCOUNTER — Encounter: Payer: Self-pay | Admitting: Obstetrics and Gynecology

## 2018-09-11 LAB — SURGICAL PATHOLOGY

## 2019-04-28 NOTE — H&P (Signed)
Erica Wells is a 38 y.o. female here for Menstrual Problem .pt here for f/up for AUB . She is six months out from H/S resection of submucosal fibroids and endometrial polyps . Pt initially did well with bleeding on 3-4 per cycle while on OCPs and now more recently bleeding has returned to heavy , changing multiple pads . She c/o of deep thrust dyspareunia for many years , but denies pelvic pain outside of her cycles . She declines future fertility  Known to have several small fibroids in [place]   Past Medical History:  has a past medical history of ADHD, Allergic state (10/12/1984), Anxiety, Bipolar 1 disorder (CMS-HCC), GERD (gastroesophageal reflux disease), Hyperlipidemia, Vasovagal syncope (06/09/2014), and Viral rash (03/28/2018).  Past Surgical History:  has a past surgical history that includes Cholecystectomy; egd (08/07/2014); Colonoscopy (2007 ???); and Fx D&C, hysteroscopy and Myosure (09/06/2018). Family History: family history includes Alzheimer's disease in her paternal grandmother; Breast cancer in her maternal aunt; Cancer in her father; Colon cancer in her maternal grandfather; Colon polyps in her mother; Coronary Artery Disease (Blocked arteries around heart) in her maternal grandmother; Diabetes type II in her father; Glaucoma in her maternal grandmother; High blood pressure (Hypertension) in her mother; Hyperlipidemia (Elevated cholesterol) in her brother, mother, and sister; Skin cancer in her maternal aunt. Social History:  reports that she has never smoked. She has never used smokeless tobacco. She reports current alcohol use. She reports that she does not use drugs. OB/GYN History:          OB History    Gravida  0   Para  0   Term  0   Preterm  0   AB  0   Living  0     SAB  0   TAB  0   Ectopic  0   Molar  0   Multiple  0   Live Births  0          Allergies: is allergic to penicillins and buspar [buspirone]. Medications:  Current Outpatient  Medications:  .  amitriptyline (ELAVIL) 10 MG tablet, TAKE 1 TO 3 TABLETS BY  MOUTH NIGHTLY AS NEEDED FOR SLEEP, Disp: 180 tablet, Rfl: 1 .  clindamycin-benzoyl peroxide (BENZACLIN) 1-5 % topical gel, Apply topically 2 (two) times daily., Disp: 100 g, Rfl: 0 .  clonazePAM (KLONOPIN) 1 MG tablet, Take 1 tablet (1 mg total) by mouth 2 (two) times daily as needed for Anxiety, Disp: 60 tablet, Rfl: 1 .  colestipoL (COLESTID) 1 gram tablet, TAKE 2 TABLETS BY MOUTH TWO TIMES DAILY, Disp: 240 tablet, Rfl: 3 .  famotidine (PEPCID) 40 MG tablet, Take 1 tablet (40 mg total) by mouth nightly, Disp: 90 tablet, Rfl: 3 .  lansoprazole (PREVACID) 30 MG DR capsule, Take 1 capsule (30 mg total) by mouth once daily, Disp: 90 capsule, Rfl: 3 .  norethindrone-ethinyl estradiol-iron (JUNEL FE 24) 1 mg-20 mcg (24)/75 mg (4) tablet, Take 1 tablet by mouth once daily, Disp: 84 tablet, Rfl: 0 .  sertraline (ZOLOFT) 100 MG tablet, Take 1 tablet (100 mg total) by mouth once daily, Disp: 90 tablet, Rfl: 3 .  topiramate (TOPAMAX) 50 MG tablet, Take 1 tablet (50 mg total) by mouth 2 (two) times daily, Disp: 180 tablet, Rfl: 3 .  dextroamphetamine-amphetamine (ADDERALL XR) 20 MG XR capsule, Take 1 capsule (20 mg total) by mouth once daily for 30 days, Disp: 90 capsule, Rfl: 0 .  rizatriptan (MAXALT) 10 MG tablet, Take 1  tablet (10 mg total) by mouth once as needed for Migraine May take a second dose after 2 hours if needed., Disp: 10 tablet, Rfl: 3  Review of Systems: General:                      No fatigue or weight loss Eyes:                           No vision changes Ears:                            No hearing difficulty Respiratory:                No cough or shortness of breath Pulmonary:                  No asthma or shortness of breath Cardiovascular:           No chest pain, palpitations, dyspnea on exertion Gastrointestinal:          No abdominal bloating, chronic diarrhea, constipations, masses, pain or  hematochezia Genitourinary:             No hematuria, dysuria, abnormal vaginal discharge, pelvic pain, Menometrorrhagia Lymphatic:                   No swollen lymph nodes Musculoskeletal:         No muscle weakness Neurologic:                  No extremity weakness, syncope, seizure disorder Psychiatric:                  No history of depression, delusions or suicidal/homicidal ideation    Exam:      Vitals:   04/29/2019   BP: 111/79  Pulse: 87    Body mass index is 31.31 kg/m.  WDWN white/  female in NAD   Lungs: CTA  CV : RRR without murmur   Neck:  no thyromegaly Abdomen: soft , no mass, normal active bowel sounds,  non-tender, no rebound tenderness Pelvic: tanner stage 5 ,  External genitalia: vulva /labia no lesions Urethra: no prolapse Vagina: normal physiologic d/c, limited room for [facility] or LAVH  Cervix: no lesions, no cervical motion tenderness   Uterus: normal size shape and contour, non-tender Adnexa: no mass,  non-tender   Rectovaginal:   Impression:   The encounter diagnosis was Abnormal uterine bleeding (AUB), unspecified.  Bleeding returning to the same level as she had before the [facility] procedure   Plan:    I have spoke to her extensively about the options of Liletta IUD , endometrial ablation or LSH  Or TAH . She is aware that if the cervical stump is left in and the fact that she has dyspareunia that she may continue to have painful sex. Likewise even if the cervix is removed ie TAh she may continue to have painful sex . She is also aware if she undergoes a [place] with morcellation that there is a chance of occult cancer that would upstage her her disease . Risk has been reported between 1/770-1/10,000  General risks of the procedure have been discussed with the pt  All questions answered

## 2019-05-05 ENCOUNTER — Encounter
Admission: RE | Admit: 2019-05-05 | Discharge: 2019-05-05 | Disposition: A | Discharge status: Home / Self Care | Payer: Managed Care, Other (non HMO) | Source: Ambulatory Visit | Attending: Obstetrics and Gynecology | Admitting: Obstetrics and Gynecology

## 2019-05-05 ENCOUNTER — Other Ambulatory Visit: Payer: Self-pay

## 2019-05-05 ENCOUNTER — Other Ambulatory Visit: Payer: Managed Care, Other (non HMO)

## 2019-05-05 NOTE — Patient Instructions (Signed)
Your procedure is scheduled on: 7/24/2020Fri Report to Same Day Surgery 2nd floor medical mall [facility] Entrance-take elevator on left to 2nd floor.  Check in with surgery information desk.) To find out your arrival time please call [phone] between 1PM - 3PM on 05/08/2019 Thur  Remember: Instructions that are not followed completely may result in serious medical risk, up to and including death, or upon the discretion of your surgeon and anesthesiologist your surgery may need to be rescheduled.    _x___ 1. Do not eat food after midnight the night before your procedure. You may drink clear liquids up to 2 hours before you are scheduled to arrive at the hospital for your procedure.  Do not drink clear liquids within 2 hours of your scheduled arrival to the hospital.  Clear liquids include  --Water or Apple juice without pulp  --Clear carbohydrate beverage such as ClearFast or Gatorade  --Black Coffee or Clear Tea (No milk, no creamers, do not add anything to                  the coffee or Tea Type 1 and type 2 diabetics should only drink water.   ____Ensure clear carbohydrate drink on the way to the hospital for bariatric patients  __x__Ensure clear carbohydrate drink 3 hours before surgery   No gum chewing or hard candies.     __x__ 2. No Alcohol for 24 hours before or after surgery.   __x__3. No Smoking or e-cigarettes for 24 prior to surgery.  Do not use any chewable tobacco products for at least 6 hour prior to surgery   ____  4. Bring all medications with you on the day of surgery if instructed.    __x__ 5. Notify your doctor if there is any change in your medical condition     (cold, fever, infections).    x___6. On the morning of surgery brush your teeth with toothpaste and water.  You may rinse your mouth with mouth wash if you wish.  Do not swallow any toothpaste or mouthwash.   Do not wear jewelry, make-up, hairpins, clips or nail polish.  Do not wear lotions,  powders, or perfumes. You may wear deodorant.  Do not shave 48 hours prior to surgery. Men may shave face and neck.  Do not bring valuables to the hospital.    [facility] is not responsible for any belongings or valuables.               Contacts, dentures or bridgework may not be worn into surgery.  Leave your suitcase in the car. After surgery it may be brought to your room.  For patients admitted to the hospital, discharge time is determined by your                       treatment team.  _  Patients discharged the day of surgery will not be allowed to drive home.  You will need someone to drive you home and stay with you the night of your procedure.    Please read over the following fact sheets that you were given:   [facility] Preparing for Surgery and or MRSA Information   _x___ Take anti-hypertensive listed below, cardiac, seizure, asthma,     anti-reflux and psychiatric medicines. These include:  1. lansoprazole (PREVACID) 30 MG capsule the night before and the morning of surgery  2.  3.  4.  5.  6.  ____Fleets enema or  Magnesium Citrate as directed.   _x___ Use CHG Soap or sage wipes as directed on instruction sheet   ____ Use inhalers on the day of surgery and bring to hospital day of surgery  ____ Stop Metformin and Janumet 2 days prior to surgery.    ____ Take 1/2 of usual insulin dose the night before surgery and none on the morning     surgery.   _x___ Follow recommendations from Cardiologist, Pulmonologist or PCP regarding          stopping Aspirin, Coumadin, Plavix ,Eliquis, Effient, or Pradaxa, and Pletal.  X____Stop Anti-inflammatories such as Advil, Aleve, Ibuprofen, Motrin, Naproxen, Naprosyn, Goodies powders or aspirin products. OK to take Tylenol and                          Celebrex.   _x___ Stop supplements until after surgery.  But may continue Vitamin D, Vitamin B,       and multivitamin.   ____ Bring C-Pap to the hospital.

## 2019-05-05 NOTE — Patient Instructions (Signed)
Your procedure is scheduled on: 05/09/2019 Fri Report to Same Day Surgery 2nd floor medical mall [facility] Entrance-take elevator on left to 2nd floor.  Check in with surgery information desk.) To find out your arrival time please call [phone] between 1PM - 3PM on 05/08/2019 Thur  Remember: Instructions that are not followed completely may result in serious medical risk, up to and including death, or upon the discretion of your surgeon and anesthesiologist your surgery may need to be rescheduled.    _x___ 1. Do not eat food after midnight the night before your procedure. You may drink clear liquids up to 2 hours before you are scheduled to arrive at the hospital for your procedure.  Do not drink clear liquids within 2 hours of your scheduled arrival to the hospital.  Clear liquids include  --Water or Apple juice without pulp  --Clear carbohydrate beverage such as ClearFast or Gatorade  --Black Coffee or Clear Tea (No milk, no creamers, do not add anything to                  the coffee or Tea Type 1 and type 2 diabetics should only drink water.   ____Ensure clear carbohydrate drink on the way to the hospital for bariatric patients  ____Ensure clear carbohydrate drink 3 hours before surgery for Dr Dwyane Luo patients if physician instructed.   No gum chewing or hard candies.     __x__ 2. No Alcohol for 24 hours before or after surgery.   __x__3. No Smoking or e-cigarettes for 24 prior to surgery.  Do not use any chewable tobacco products for at least 6 hour prior to surgery   ____  4. Bring all medications with you on the day of surgery if instructed.    __x__ 5. Notify your doctor if there is any change in your medical condition     (cold, fever, infections).    x___6. On the morning of surgery brush your teeth with toothpaste and water.  You may rinse your mouth with mouth wash if you wish.  Do not swallow any toothpaste or mouthwash.   Do not wear jewelry, make-up,  hairpins, clips or nail polish.  Do not wear lotions, powders, or perfumes. You may wear deodorant.  Do not shave 48 hours prior to surgery. Men may shave face and neck.  Do not bring valuables to the hospital.    [facility] is not responsible for any belongings or valuables.               Contacts, dentures or bridgework may not be worn into surgery.  Leave your suitcase in the car. After surgery it may be brought to your room.  For patients admitted to the hospital, discharge time is determined by your                       treatment team.  _  Patients discharged the day of surgery will not be allowed to drive home.  You will need someone to drive you home and stay with you the night of your procedure.    Please read over the following fact sheets that you were given:   [facility] Preparing for Surgery and or MRSA Information   _x___ Take anti-hypertensive listed below, cardiac, seizure, asthma,     anti-reflux and psychiatric medicines. These include:  1. lansoprazole (PREVACID) 30 MG capsule the night before and the morning of surgery  2.  3.  4.  5.  6.  ____Fleets enema or Magnesium Citrate as directed.   _x___ Use CHG Soap or sage wipes as directed on instruction sheet   ____ Use inhalers on the day of surgery and bring to hospital day of surgery  ____ Stop Metformin and Janumet 2 days prior to surgery.    ____ Take 1/2 of usual insulin dose the night before surgery and none on the morning     surgery.   _x___ Follow recommendations from Cardiologist, Pulmonologist or PCP regarding          stopping Aspirin, Coumadin, Plavix ,Eliquis, Effient, or Pradaxa, and Pletal.  X____Stop Anti-inflammatories such as Advil, Aleve, Ibuprofen, Motrin, Naproxen, Naprosyn, Goodies powders or aspirin products. OK to take Tylenol and                          Celebrex.   _x___ Stop supplements until after surgery.  But may continue Vitamin D, Vitamin B,       and multivitamin.   ____  Bring C-Pap to the hospital.

## 2019-05-06 ENCOUNTER — Other Ambulatory Visit: Payer: Managed Care, Other (non HMO)

## 2019-05-06 ENCOUNTER — Other Ambulatory Visit: Payer: Self-pay

## 2019-05-06 ENCOUNTER — Other Ambulatory Visit
Admission: RE | Admit: 2019-05-06 | Discharge: 2019-05-06 | Disposition: A | Discharge status: Home / Self Care | Payer: Managed Care, Other (non HMO) | Source: Ambulatory Visit | Attending: Obstetrics and Gynecology | Admitting: Obstetrics and Gynecology

## 2019-05-06 DIAGNOSIS — Z1159 Encounter for screening for other viral diseases: Secondary | ICD-10-CM | POA: Diagnosis not present

## 2019-05-06 DIAGNOSIS — Z01812 Encounter for preprocedural laboratory examination: Secondary | ICD-10-CM | POA: Insufficient documentation

## 2019-05-06 LAB — TYPE AND SCREEN
ABO/RH(D): O POS
Antibody Screen: NEGATIVE

## 2019-05-06 LAB — BASIC METABOLIC PANEL
Anion gap: 10 (ref 5–15)
BUN: 11 mg/dL (ref 6–20)
CO2: 19 mmol/L — ABNORMAL LOW (ref 22–32)
Calcium: 9.2 mg/dL (ref 8.9–10.3)
Chloride: 108 mmol/L (ref 98–111)
Creatinine, Ser: 0.75 mg/dL (ref 0.44–1.00)
GFR calc Af Amer: >60 mL/min (ref >60)
GFR calc non Af Amer: >60 mL/min (ref >60)
Glucose, Bld: 83 mg/dL (ref 70–99)
Potassium: 3.6 mmol/L (ref 3.5–5.1)
Sodium: 137 mmol/L (ref 135–145)

## 2019-05-06 LAB — CBC
HCT: 38.2 % (ref 36.0–46.0)
Hemoglobin: 12.6 g/dL (ref 12.0–15.0)
MCH: 28.8 pg (ref 26.0–34.0)
MCHC: 33 g/dL (ref 30.0–36.0)
MCV: 87.2 fL (ref 80.0–100.0)
Platelets: 373 10*3/uL (ref 150–400)
RBC: 4.38 MIL/uL (ref 3.87–5.11)
RDW: 12.7 % (ref 11.5–15.5)
WBC: 9 10*3/uL (ref 4.0–10.5)
nRBC: 0 % (ref 0.0–0.2)

## 2019-05-06 LAB — SARS CORONAVIRUS 2 (TAT 6-24 HRS): SARS Coronavirus 2: NEGATIVE

## 2019-05-08 MED ORDER — CLINDAMYCIN PHOSPHATE 900 MG/50ML IV SOLN
900.0000 mg | INTRAVENOUS | Status: AC
  Administered 2019-05-09: 900 mg via INTRAVENOUS

## 2019-05-08 MED ORDER — GENTAMICIN SULFATE 40 MG/ML IJ SOLN
5.0000 mg/kg | INTRAVENOUS | Status: AC
  Administered 2019-05-09: 480 mg via INTRAVENOUS
  Filled 2019-05-08: qty 12

## 2019-05-09 ENCOUNTER — Encounter
Admission: RE | Disposition: A | Discharge status: Home / Self Care | Payer: Self-pay | Source: Home / Self Care | Attending: Obstetrics and Gynecology

## 2019-05-09 ENCOUNTER — Observation Stay: Payer: Managed Care, Other (non HMO) | Admitting: Anesthesiology

## 2019-05-09 ENCOUNTER — Encounter: Payer: Self-pay | Admitting: *Deleted

## 2019-05-09 ENCOUNTER — Other Ambulatory Visit: Payer: Self-pay

## 2019-05-09 ENCOUNTER — Observation Stay
Admission: RE | Admit: 2019-05-09 | Discharge: 2019-05-09 | Disposition: A | Discharge status: Home / Self Care | Payer: Managed Care, Other (non HMO) | Attending: Obstetrics and Gynecology | Admitting: Obstetrics and Gynecology

## 2019-05-09 DIAGNOSIS — F419 Anxiety disorder, unspecified: Secondary | ICD-10-CM | POA: Diagnosis not present

## 2019-05-09 DIAGNOSIS — Z79899 Other long term (current) drug therapy: Secondary | ICD-10-CM | POA: Insufficient documentation

## 2019-05-09 DIAGNOSIS — K219 Gastro-esophageal reflux disease without esophagitis: Secondary | ICD-10-CM | POA: Insufficient documentation

## 2019-05-09 DIAGNOSIS — D251 Intramural leiomyoma of uterus: Secondary | ICD-10-CM | POA: Diagnosis not present

## 2019-05-09 DIAGNOSIS — F909 Attention-deficit hyperactivity disorder, unspecified type: Secondary | ICD-10-CM | POA: Diagnosis not present

## 2019-05-09 DIAGNOSIS — N939 Abnormal uterine and vaginal bleeding, unspecified: Secondary | ICD-10-CM | POA: Diagnosis present

## 2019-05-09 DIAGNOSIS — N803 Endometriosis of pelvic peritoneum: Secondary | ICD-10-CM | POA: Diagnosis not present

## 2019-05-09 DIAGNOSIS — N920 Excessive and frequent menstruation with regular cycle: Secondary | ICD-10-CM | POA: Diagnosis not present

## 2019-05-09 DIAGNOSIS — Z793 Long term (current) use of hormonal contraceptives: Secondary | ICD-10-CM | POA: Diagnosis not present

## 2019-05-09 DIAGNOSIS — Z9889 Other specified postprocedural states: Secondary | ICD-10-CM

## 2019-05-09 DIAGNOSIS — D250 Submucous leiomyoma of uterus: Secondary | ICD-10-CM | POA: Insufficient documentation

## 2019-05-09 HISTORY — PX: LAPAROSCOPIC SUPRACERVICAL HYSTERECTOMY: SHX5399

## 2019-05-09 LAB — ABO/RH: ABO/RH(D): O POS

## 2019-05-09 SURGERY — HYSTERECTOMY, SUPRACERVICAL, LAPAROSCOPIC
Anesthesia: General

## 2019-05-09 MED ORDER — SUCCINYLCHOLINE CHLORIDE 20 MG/ML IJ SOLN
INTRAMUSCULAR | Status: AC
  Filled 2019-05-09: qty 1

## 2019-05-09 MED ORDER — LACTATED RINGERS IV SOLN
INTRAVENOUS | Status: DC
  Administered 2019-05-09 (×3): via INTRAVENOUS

## 2019-05-09 MED ORDER — HYDROMORPHONE HCL 1 MG/ML IJ SOLN
INTRAMUSCULAR | Status: AC
  Administered 2019-05-09: 09:00:00 0.5 mg via INTRAVENOUS
  Filled 2019-05-09: qty 1

## 2019-05-09 MED ORDER — PROPOFOL 10 MG/ML IV BOLUS
INTRAVENOUS | Status: AC
  Filled 2019-05-09: qty 60

## 2019-05-09 MED ORDER — LIDOCAINE HCL (PF) 2 % IJ SOLN
INTRAMUSCULAR | Status: AC
  Filled 2019-05-09: qty 20

## 2019-05-09 MED ORDER — SODIUM CHLORIDE FLUSH 0.9 % IV SOLN
INTRAVENOUS | Status: AC
  Filled 2019-05-09: qty 3

## 2019-05-09 MED ORDER — KETOROLAC TROMETHAMINE 30 MG/ML IJ SOLN
INTRAMUSCULAR | Status: DC | PRN
  Administered 2019-05-09: 30 mg via INTRAVENOUS

## 2019-05-09 MED ORDER — OXYCODONE HCL 5 MG PO TABS: 5.0000 mg | ORAL_TABLET | ORAL | Status: DC | PRN

## 2019-05-09 MED ORDER — ACETAMINOPHEN 325 MG PO TABS: 325.0000 mg | ORAL_TABLET | ORAL | Status: DC | PRN

## 2019-05-09 MED ORDER — HYDROCODONE-ACETAMINOPHEN 7.5-325 MG PO TABS
ORAL_TABLET | ORAL | Status: AC
  Filled 2019-05-09: qty 1

## 2019-05-09 MED ORDER — PROMETHAZINE HCL 25 MG/ML IJ SOLN
6.2500 mg | INTRAMUSCULAR | Status: DC | PRN
  Administered 2019-05-09: 12.5 mg via INTRAVENOUS

## 2019-05-09 MED ORDER — SILVER NITRATE-POT NITRATE 75-25 % EX MISC
CUTANEOUS | Status: DC | PRN
  Administered 2019-05-09: 2 via TOPICAL

## 2019-05-09 MED ORDER — CLINDAMYCIN PHOSPHATE 900 MG/50ML IV SOLN
INTRAVENOUS | Status: AC
  Filled 2019-05-09: qty 50

## 2019-05-09 MED ORDER — SUGAMMADEX SODIUM 200 MG/2ML IV SOLN
INTRAVENOUS | Status: DC | PRN
  Administered 2019-05-09: 190.6 mg via INTRAVENOUS

## 2019-05-09 MED ORDER — GLYCOPYRROLATE 0.2 MG/ML IJ SOLN
INTRAMUSCULAR | Status: AC
  Filled 2019-05-09: qty 1

## 2019-05-09 MED ORDER — ACETAMINOPHEN 500 MG PO TABS
1000.0000 mg | ORAL_TABLET | ORAL | Status: AC
  Administered 2019-05-09: 1000 mg via ORAL

## 2019-05-09 MED ORDER — ONDANSETRON HCL 4 MG/2ML IJ SOLN: 4.0000 mg | Freq: Four times a day (QID) | INTRAMUSCULAR | Status: DC | PRN

## 2019-05-09 MED ORDER — DEXAMETHASONE SODIUM PHOSPHATE 10 MG/ML IJ SOLN
INTRAMUSCULAR | Status: AC
  Filled 2019-05-09: qty 1

## 2019-05-09 MED ORDER — LIDOCAINE HCL (PF) 2 % IJ SOLN
INTRAMUSCULAR | Status: AC
  Filled 2019-05-09: qty 10

## 2019-05-09 MED ORDER — SUGAMMADEX SODIUM 200 MG/2ML IV SOLN
INTRAVENOUS | Status: AC
  Filled 2019-05-09: qty 2

## 2019-05-09 MED ORDER — GABAPENTIN 300 MG PO CAPS
300.0000 mg | ORAL_CAPSULE | ORAL | Status: AC
  Administered 2019-05-09: 300 mg via ORAL

## 2019-05-09 MED ORDER — FENTANYL CITRATE (PF) 100 MCG/2ML IJ SOLN
INTRAMUSCULAR | Status: DC | PRN
  Administered 2019-05-09: 50 ug via INTRAVENOUS
  Administered 2019-05-09: 100 ug via INTRAVENOUS

## 2019-05-09 MED ORDER — BUPIVACAINE HCL 0.5 % IJ SOLN
INTRAMUSCULAR | Status: DC | PRN
  Administered 2019-05-09: 10 mL

## 2019-05-09 MED ORDER — ONDANSETRON HCL 4 MG PO TABS: 4.0000 mg | ORAL_TABLET | Freq: Four times a day (QID) | ORAL | Status: DC | PRN

## 2019-05-09 MED ORDER — DEXAMETHASONE SODIUM PHOSPHATE 10 MG/ML IJ SOLN
INTRAMUSCULAR | Status: DC | PRN
  Administered 2019-05-09: 10 mg via INTRAVENOUS

## 2019-05-09 MED ORDER — GABAPENTIN 300 MG PO CAPS
ORAL_CAPSULE | ORAL | Status: AC
  Administered 2019-05-09: 07:00:00 300 mg via ORAL
  Filled 2019-05-09: qty 1

## 2019-05-09 MED ORDER — PHENYLEPHRINE HCL (PRESSORS) 10 MG/ML IV SOLN
INTRAVENOUS | Status: AC
  Filled 2019-05-09: qty 1

## 2019-05-09 MED ORDER — ACETAMINOPHEN 500 MG PO TABS: 1000.0000 mg | ORAL_TABLET | Freq: Four times a day (QID) | ORAL | Status: DC

## 2019-05-09 MED ORDER — MEPERIDINE HCL 50 MG/ML IJ SOLN: 6.2500 mg | INTRAMUSCULAR | Status: DC | PRN

## 2019-05-09 MED ORDER — MIDAZOLAM HCL 2 MG/2ML IJ SOLN
INTRAMUSCULAR | Status: DC | PRN
  Administered 2019-05-09: 2 mg via INTRAVENOUS

## 2019-05-09 MED ORDER — ONDANSETRON HCL 4 MG/2ML IJ SOLN
INTRAMUSCULAR | Status: DC | PRN
  Administered 2019-05-09: 4 mg via INTRAVENOUS

## 2019-05-09 MED ORDER — HYDROMORPHONE HCL 1 MG/ML IJ SOLN
0.2500 mg | INTRAMUSCULAR | Status: DC | PRN
  Administered 2019-05-09: 0.5 mg via INTRAVENOUS
  Administered 2019-05-09 (×2): 0.25 mg via INTRAVENOUS

## 2019-05-09 MED ORDER — SCOPOLAMINE 1 MG/3DAYS TD PT72
MEDICATED_PATCH | TRANSDERMAL | Status: DC | PRN
  Administered 2019-05-09: 1 via TRANSDERMAL

## 2019-05-09 MED ORDER — PROMETHAZINE HCL 25 MG/ML IJ SOLN
INTRAMUSCULAR | Status: AC
  Administered 2019-05-09: 09:00:00 12.5 mg via INTRAVENOUS
  Filled 2019-05-09: qty 1

## 2019-05-09 MED ORDER — PROPOFOL 10 MG/ML IV BOLUS
INTRAVENOUS | Status: DC | PRN
  Administered 2019-05-09: 200 mg via INTRAVENOUS

## 2019-05-09 MED ORDER — MORPHINE SULFATE (PF) 4 MG/ML IV SOLN: 1.0000 mg | INTRAVENOUS | Status: DC | PRN

## 2019-05-09 MED ORDER — ROCURONIUM BROMIDE 50 MG/5ML IV SOLN
INTRAVENOUS | Status: AC
  Filled 2019-05-09: qty 3

## 2019-05-09 MED ORDER — FENTANYL CITRATE (PF) 250 MCG/5ML IJ SOLN
INTRAMUSCULAR | Status: AC
  Filled 2019-05-09: qty 5

## 2019-05-09 MED ORDER — LIDOCAINE HCL (CARDIAC) PF 100 MG/5ML IV SOSY
PREFILLED_SYRINGE | INTRAVENOUS | Status: DC | PRN
  Administered 2019-05-09: 100 mg via INTRAVENOUS

## 2019-05-09 MED ORDER — ACETAMINOPHEN 160 MG/5ML PO SOLN
325.0000 mg | ORAL | Status: DC | PRN
  Filled 2019-05-09: qty 20.3

## 2019-05-09 MED ORDER — HYDROCODONE-ACETAMINOPHEN 7.5-325 MG PO TABS
1.0000 | ORAL_TABLET | Freq: Once | ORAL | Status: AC | PRN
  Administered 2019-05-09: 1 via ORAL
  Filled 2019-05-09: qty 1

## 2019-05-09 MED ORDER — SCOPOLAMINE 1 MG/3DAYS TD PT72
MEDICATED_PATCH | TRANSDERMAL | Status: AC
  Filled 2019-05-09: qty 1

## 2019-05-09 MED ORDER — ROCURONIUM BROMIDE 100 MG/10ML IV SOLN
INTRAVENOUS | Status: DC | PRN
  Administered 2019-05-09: 10 mg via INTRAVENOUS
  Administered 2019-05-09: 50 mg via INTRAVENOUS
  Administered 2019-05-09: 10 mg via INTRAVENOUS

## 2019-05-09 MED ORDER — MIDAZOLAM HCL 2 MG/2ML IJ SOLN
INTRAMUSCULAR | Status: AC
  Filled 2019-05-09: qty 2

## 2019-05-09 MED ORDER — ACETAMINOPHEN 500 MG PO TABS
ORAL_TABLET | ORAL | Status: AC
  Administered 2019-05-09: 1000 mg via ORAL
  Filled 2019-05-09: qty 2

## 2019-05-09 MED ORDER — LACTATED RINGERS IV SOLN: INTRAVENOUS | Status: DC

## 2019-05-09 SURGICAL SUPPLY — 43 items
BAG URINE DRAINAGE (UROLOGICAL SUPPLIES) ×3 IMPLANT
BLADE SURG SZ11 CARB STEEL (BLADE) ×3 IMPLANT
CANISTER SUCT 1200ML W/VALVE (MISCELLANEOUS) ×3 IMPLANT
CATH FOLEY 2WAY  5CC 16FR (CATHETERS) ×2
CATH URTH 16FR FL 2W BLN LF (CATHETERS) ×1 IMPLANT
CHLORAPREP W/TINT 26 (MISCELLANEOUS) ×3 IMPLANT
CLOSURE WOUND 1/2 X4 (GAUZE/BANDAGES/DRESSINGS) ×1
COVER WAND RF STERILE (DRAPES) IMPLANT
DRSG TEGADERM 2-3/8X2-3/4 SM (GAUZE/BANDAGES/DRESSINGS) ×9 IMPLANT
GLOVE BIO SURGEON STRL SZ8 (GLOVE) ×9 IMPLANT
GOWN STRL REUS W/ TWL LRG LVL3 (GOWN DISPOSABLE) ×2 IMPLANT
GOWN STRL REUS W/ TWL XL LVL3 (GOWN DISPOSABLE) ×1 IMPLANT
GOWN STRL REUS W/TWL LRG LVL3 (GOWN DISPOSABLE) ×4
GOWN STRL REUS W/TWL XL LVL3 (GOWN DISPOSABLE) ×2
GRASPER SUT TROCAR 14GX15 (MISCELLANEOUS) ×3 IMPLANT
IRRIGATION STRYKERFLOW (MISCELLANEOUS) IMPLANT
IRRIGATOR STRYKERFLOW (MISCELLANEOUS) ×3
IV LACTATED RINGERS 1000ML (IV SOLUTION) IMPLANT
KIT PINK PAD W/HEAD ARE REST (MISCELLANEOUS) ×3
KIT PINK PAD W/HEAD ARM REST (MISCELLANEOUS) ×1 IMPLANT
KIT TURNOVER CYSTO (KITS) ×3 IMPLANT
LABEL OR SOLS (LABEL) ×3 IMPLANT
MORCELLATOR XCISE  COR (MISCELLANEOUS)
MORCELLATOR XCISE COR (MISCELLANEOUS) IMPLANT
NS IRRIG 500ML POUR BTL (IV SOLUTION) ×3 IMPLANT
PACK GYN LAPAROSCOPIC (MISCELLANEOUS) ×3 IMPLANT
PAD OB MATERNITY 4.3X12.25 (PERSONAL CARE ITEMS) ×3 IMPLANT
PAD PREP 24X41 OB/GYN DISP (PERSONAL CARE ITEMS) ×3 IMPLANT
SET CYSTO W/LG BORE CLAMP LF (SET/KITS/TRAYS/PACK) IMPLANT
SET TUBE SMOKE EVAC HIGH FLOW (TUBING) ×3 IMPLANT
SHEARS HARMONIC ACE PLUS 36CM (ENDOMECHANICALS) ×2 IMPLANT
SLEEVE ENDOPATH XCEL 5M (ENDOMECHANICALS) ×3 IMPLANT
SOLUTION ELECTROLUBE (MISCELLANEOUS) ×3 IMPLANT
SPONGE GAUZE 2X2 8PLY STER LF (GAUZE/BANDAGES/DRESSINGS) ×2
SPONGE GAUZE 2X2 8PLY STRL LF (GAUZE/BANDAGES/DRESSINGS) ×4 IMPLANT
STRIP CLOSURE SKIN 1/2X4 (GAUZE/BANDAGES/DRESSINGS) ×2 IMPLANT
SUT VIC AB 2-0 UR6 27 (SUTURE) ×3 IMPLANT
SUT VIC AB 4-0 SH 27 (SUTURE) ×4
SUT VIC AB 4-0 SH 27XANBCTRL (SUTURE) ×2 IMPLANT
SYR 10ML LL (SYRINGE) ×3 IMPLANT
SYR 20ML LL LF (SYRINGE) ×3 IMPLANT
TROCAR ENDO BLADELESS 11MM (ENDOMECHANICALS) ×3 IMPLANT
TROCAR XCEL NON-BLD 5MMX100MML (ENDOMECHANICALS) ×3 IMPLANT

## 2019-05-09 NOTE — Progress Notes (Signed)
Pt ready for [facility] and bilateral salpingectomy. Labs reviewed . Neg HCG + neg Covid . All questions answered .  Proceed

## 2019-05-09 NOTE — Anesthesia Post-op Follow-up Note (Signed)
Anesthesia QCDR form completed.        

## 2019-05-09 NOTE — Anesthesia Procedure Notes (Signed)
Procedure Name: Intubation Date/Time: 05/09/2019 7:33 AM Performed by: Leander Rams, CRNA Pre-anesthesia Checklist: Patient identified, Emergency Drugs available, Suction available, Patient being monitored and Timeout performed Patient Re-evaluated:Patient Re-evaluated prior to induction Preoxygenation: Pre-oxygenation with 100% oxygen Induction Type: IV induction Ventilation: Mask ventilation without difficulty Laryngoscope Size: McGraph and 3 Grade View: Grade I Tube type: Oral Tube size: 7.0 mm Number of attempts: 1 Airway Equipment and Method: Stylet Placement Confirmation: ETT inserted through vocal cords under direct vision Secured at: 22 cm Tube secured with: Tape Dental Injury: Teeth and Oropharynx as per pre-operative assessment

## 2019-05-09 NOTE — Discharge Instructions (Signed)
Supracervical Hysterectomy, Care After This sheet gives you information about how to care for yourself after your procedure. Your health care provider may also give you more specific instructions. If you have problems or questions, contact your health care provider. What can I expect after the procedure? After the procedure, it is common to have some discomfort, tenderness, swelling, and bruising at the surgical area. This normally lasts for about 2 weeks. Follow these instructions at home: Medicines  Take over-the-counter and prescription medicines only as told by your health care provider.  Do not take aspirin. It can cause bleeding. Ask your health care provider when it is safe to use aspirin again.  Do not drive or use heavy machinery while taking prescription pain medicine.  To prevent or treat constipation while you are taking prescription pain medicine, your health care provider may recommend that you: ? Drink enough fluid to keep your urine pale yellow. ? Take over-the-counter or prescription medicines. ? Eat foods that are high in fiber, such as fresh fruits and vegetables, whole grains, and beans. ? Limit foods that are high in fat and processed sugars, such as fried and sweet foods. Activity  Get plenty of rest and sleep.  Try to have someone home with you for 1-2 weeks to help you with everyday chores.  Return to your normal activities as told by your health care provider. Ask your health care provider what activities are safe for you.  Do not lift anything that is heavier than 10 lb (4.5 kg) or the limit that your health care provider tells you until he or she says that it is safe.  Do not douche, use tampons, or have sex for at least 6 weeks or until your health care provider says it is safe to do so. Incision care   Follow instructions from your health care provider about how to take care of your incisions. Make sure you: ? Wash your hands with soap and water before  you change your bandage (dressing). If soap and water are not available, use hand sanitizer. ? Change your dressing as told by your health care provider. ? Leave stitches (sutures), skin glue, or adhesive strips in place. These skin closures may need to stay in place for 2 weeks or longer. If adhesive strip edges start to loosen and curl up, you may trim the loose edges. Do not remove adhesive strips completely unless your health care provider tells you to do that.  Check your incision area every day for signs of infection. Check for: ? Redness, swelling, or pain. ? Fluid or blood. ? Warmth. ? Pus or a bad smell.  Take showers instead of baths for 2-3 weeks or as told by your health care provider. Eating and drinking  Drink enough fluids to keep your urine clear or pale yellow.  Do not drink alcohol until your health care provider says it is okay to do so. General instructions  Monitor your temperature for as long as told by your health care provider.  Keep all follow-up visits as told by your health care provider. This is important. Contact a health care provider if:  You have redness, swelling, or pain around an incision.  You have chills or fever.  You have fluid or blood coming from an incision.  An incision feels warm to the touch.  You have pus or a bad smell coming from an incision.  Your incisions break open.  You feel dizzy or lightheaded.  You have pain or  bleeding when you urinate.  You have diarrhea that does not go away.  You have nausea and vomiting that do not go away.  You have abnormal vaginal discharge.  You have a rash.  You have pain that does not go away when you take medicine. Get help right away if:  You have a fever and your symptoms suddenly get worse.  You have severe abdominal pain.  You have chest pain.  You have shortness of breath.  You faint.  You have pain, swelling, or redness in your leg.  You have heavy vaginal bleeding  with blood clots. Summary  After the procedure, it is common to have some discomfort, tenderness, swelling, and bruising at the surgical site. This normally lasts for about 2 weeks.  Get help right away if you have excessive vaginal bleeding or severe abdominal pain. This information is not intended to replace advice given to you by your health care provider. Make sure you discuss any questions you have with your health care provider. Document Released: 07/23/2013 Document Revised: 09/14/2017 Document Reviewed: 12/28/2016 Elsevier Patient Education  [place]   1) The drugs that you were given will stay in your system until tomorrow so for the next 24 hours you should not:  A) Drive an automobile B) Make any legal decisions C) Drink any alcoholic beverage   2) You may resume regular meals tomorrow.  Today it is better to start with liquids and gradually work up to solid foods.  You may eat anything you prefer, but it is better to start with liquids, then soup and crackers, and gradually work up to solid foods.   3) Please notify your doctor immediately if you have any unusual bleeding, trouble breathing, redness and pain at the surgery site, drainage, fever, or pain not relieved by medication.    4) Additional Instructions: Call  MD if sever pain or bleeding,                                                   Follow up in 2 weeks                                                   Take pain meds that you have at home         Please contact your physician with any problems or Same Day Surgery at [phone], Monday through Friday 6 am to 4 pm, or [place] at [facility] number at [phone].

## 2019-05-09 NOTE — Brief Op Note (Signed)
05/09/2019  8:57 AM  PATIENT:  Erica Wells  38 y.o. female  PRE-OPERATIVE DIAGNOSIS:  abnormal uterine bleeding, fibroids  POST-OPERATIVE DIAGNOSIS:  abnormal uterine bleeding, fibroids, possible endometriosis  PROCEDURE:  Procedure(s): LAPAROSCOPIC SUPRACERVICAL HYSTERECTOMY, Bilateral Salpingectomy (N/A)  SURGEON:  Surgeon(s) and Role:    * Schermerhorn, Gwen Her, MD - Primary    * Benjaman Kindler, MD - Assisting  PHYSICIAN ASSISTANT:   ASSISTANTS: none   ANESTHESIA:   general  EBL:  5 mL   BLOOD ADMINISTERED:none  DRAINS: none   LOCAL MEDICATIONS USED:  MARCAINE     SPECIMEN:  Source of Specimen:  cervix , uterus and fallopian tubes . peritoneal bx for presummed endometriosis  DISPOSITION OF SPECIMEN:  PATHOLOGY  COUNTS:  YES  TOURNIQUET:  * No tourniquets in log *  DICTATION: .Other Dictation: Dictation Number verbal  PLAN OF CARE: Discharge to home after PACU  PATIENT DISPOSITION:  PACU - hemodynamically stable.   Delay start of Pharmacological VTE agent (>24hrs) due to surgical blood loss or risk of bleeding: not applicable

## 2019-05-09 NOTE — Anesthesia Preprocedure Evaluation (Signed)
Anesthesia Evaluation  Patient identified by MRN, date of birth, ID band Patient awake    Reviewed: Allergy & Precautions, H&P , NPO status , reviewed documented beta blocker date and time   History of Anesthesia Complications (+) PONV and history of anesthetic complications  Airway Mallampati: II  TM Distance: >3 FB Neck ROM: full    Dental  (+) Teeth Intact   Pulmonary    Pulmonary exam normal        Cardiovascular Normal cardiovascular exam     Neuro/Psych PSYCHIATRIC DISORDERS Anxiety    GI/Hepatic GERD  ,  Endo/Other    Renal/GU      Musculoskeletal   Abdominal   Peds  Hematology  (+) Blood dyscrasia, anemia ,   Anesthesia Other Findings Past Medical History: No date: Anemia     Comment:  YEARS AGO No date: Anxiety No date: Complication of anesthesia No date: GERD (gastroesophageal reflux disease) No date: PONV (postoperative nausea and vomiting) Past Surgical History: 2014: CHOLECYSTECTOMY 09/06/2018: DILATATION & CURETTAGE/HYSTEROSCOPY WITH MYOSURE; N/A     Comment:  Procedure: DILATATION & CURETTAGE/HYSTEROSCOPY WITH               MYOSURE-resection of polyps,submucosal polyps;  Surgeon:               Boykin Nearing, MD;  Location: [facility];                Service: Gynecology;  Laterality: N/A; 12/03/2017: HIP ARTHROSCOPY; Right     Comment:  Procedure: ARTHROSCOPY HIP;  Surgeon: Leim Fabry, MD;               Location: [facility];  Service: Orthopedics;  Laterality:               Right; BMI    Body Mass Index: 31.01 kg/m     Reproductive/Obstetrics                             Anesthesia Physical Anesthesia Plan  ASA: II  Anesthesia Plan: General   Post-op Pain Management:    Induction: Intravenous  PONV Risk Score and Plan: Ondansetron and Treatment may vary due to age or medical condition  Airway Management Planned: Oral ETT  Additional Equipment:    Intra-op Plan:   Post-operative Plan: Extubation in OR  Informed Consent: I have reviewed the patients History and Physical, chart, labs and discussed the procedure including the risks, benefits and alternatives for the proposed anesthesia with the patient or authorized representative who has indicated his/her understanding and acceptance.     Dental Advisory Given  Plan Discussed with: CRNA  Anesthesia Plan Comments:         Anesthesia Quick Evaluation

## 2019-05-09 NOTE — Op Note (Signed)
NAME: Erica Wells, Erica Wells MEDICAL RECORD TS:[id number] ACCOUNT [id number] DATE OF BIRTH:1981-09-02 FACILITY: [facility] LOCATION: [facility] PHYSICIAN:Lexiana Spindel Josefine Class, MD  OPERATIVE REPORT  DATE OF PROCEDURE:  05/09/2019  PREOPERATIVE DIAGNOSES: 1.  Menorrhagia. 2.  Fibroids.  POSTOPERATIVE DIAGNOSES: 1.  Menorrhagia. 2.  Fibroids. 3.  Possible endometriosis.  PROCEDURE:   1  Laparoscopic supracervical hysterectomy. 2.  Bilateral salpingectomy. 3.  Peritoneal biopsy for presumed endometriosis.  SURGEON:  Laverta Baltimore, MD  FIRST ASSISTANT:  Benjaman Kindler, MD  ANESTHESIA:  General endotracheal anesthesia.  INDICATION:  A 38 year old gravida 0 patient with a long history of menorrhagia, failing prior submucosal resection.  The patient has tried several hormonal therapies and has been unresponsive with continued bleeding.  The patient desires definitive  surgery.  DESCRIPTION OF PROCEDURE:  After adequate general endotracheal anesthesia, the patient was placed in dorsal supine position with the legs in the [place] stirrups.  The patient's abdomen, perineum, and vagina were prepped and draped in normal sterile  fashion.  The patient did receive gentamicin and clindamycin prophylactically before the start of the case for surgical prophylaxis.  Time-out was performed.  Foley catheter was placed in the bladder.  It yielded clear urine and a speculum was placed in  the vagina, and the anterior cervix was grasped with a single-tooth tenaculum.  A uterine sound was placed in the endometrial cavity, and the 2 were tethered together with Steri-Strips.  This will be used for uterine manipulation during the procedure.   Gloves were changed.  Attention was directed to the patient's abdomen where a 5 mm infraumbilical incision was made after injecting with 0.5% Marcaine.  The hysteroscope was advanced into the abdominal cavity under direct visualization with the Optiview  cannula.  A  second port site was placed in the left lower quadrant 3 cm medial to the anterior iliac spine, and an 11 mm trocar was advanced under direct visualization.  A third port was placed in the right lower quadrant, again 3 cm medial to the right  anterior iliac spine and under direct visualization, a port was placed.  The patient was placed in Trendelenburg.  Initial findings showed hemosiderin deposits on the uterosacral ligaments bilaterally.  There were some filmy adhesions on the right mid  sidewall with the bowel attached to the sidewall and 1 adhesion on the right ovary to the ovarian fossa.  Harmonic scalpel was brought up to the operative field, and a peritoneal biopsy was performed of the hemosiderin deposit.  Good hemostasis was  noted.  Attention was then directed to the patient's left fallopian tube which was grasped with the fimbriated end.  The mesosalpinx was dissected to the cornua.  The uteroovarian ligament was then clamped, cauterized, and the broad ligament was opened  and the left uterine artery was skeletonized.  The artery was cauterized with the Kleppingers and transected with the Harmonic scalpel.  Bladder flap was opened and a similar procedure was repeated on the patient's right side.  Again, the fimbriated end  of the fallopian tube was grasped, and the mesosalpinx was dissected to the cornua.  The uteroovarian ligament was clamped and transected, and the broad ligament was then opened again to the right uterine artery.  Kleppinger cautery was used to cauterize  the uterine artery, and then the artery was transected with the Harmonic scalpel.  Cervix was then transected with the Harmonic scalpel to the level of the uterosacral ligaments.  The uterine sound was removed, and the endocervical canal was cauterized  with the Kleppinger cautery.  Good hemostasis was noted.  The morcellator was brought up and placed through the left lower port site, and morcellation of the uterus and  fallopian tubes ensued.  Good hemostasis was noted.  The ureters were identified  prior to commencement of the case and at the end of the case with good peristaltic activity bilaterally.  Pressure was lowered to 7 mmHg.  Good hemostasis was noted.  The upper abdomen appeared normal.  The filmy adhesions on the right abdominal sidewall  were taken down with the Harmonic scalpel as well as the adhesion from the right ovary to the ovarian fossa.  The morcellator was removed from the left port site, and the fascia was closed with 0 Vicryl suture, 2 separate sutures, with good closure of  the fascia.  The patient's abdomen was decompressed, and all incisions were closed with interrupted 4-0 Vicryl suture.  Sterile dressing applied.  The single-tooth tenaculum was removed from the cervix, and silver nitrate was used for some oozing.  COMPLICATIONS:  There were no complications.  ESTIMATED BLOOD LOSS:  5 mL.  INTRAOPERATIVE FLUIDS:  1000 mL.  URINE OUTPUT:  400 mL.  DISPOSITION:  The patient did receive 30 mg intravenous Toradol at the end of the case and was taken to recovery room in good condition.  LN/NUANCE  D:05/09/2019 T:05/09/2019 JOB:[id number]

## 2019-05-09 NOTE — Transfer of Care (Signed)
Immediate Anesthesia Transfer of Care Note  Patient: Erica Wells  Procedure(s) Performed: LAPAROSCOPIC SUPRACERVICAL HYSTERECTOMY, Bilateral Salpingectomy (N/A )  Patient Location: PACU  Anesthesia Type:General  Level of Consciousness: awake  Airway & Oxygen Therapy: Patient Spontanous Breathing  Post-op Assessment: Report given to RN  Post vital signs: stable  Last Vitals:  Vitals Value Taken Time  BP 140/86 05/09/19 0910  Temp 36.9 C 05/09/19 0910  Pulse 94 05/09/19 0916  Resp 20 05/09/19 0916  SpO2 100 % 05/09/19 0916  Vitals shown include unvalidated device data.  Last Pain:  Vitals:   05/09/19 0615  TempSrc: Oral  PainSc: 1          Complications: No apparent anesthesia complications

## 2019-05-10 NOTE — Discharge Summary (Signed)
Pt was not placed in Observation status  And she was not admitted. She was d/c from PACU as an outpatient surgery

## 2019-05-13 LAB — SURGICAL PATHOLOGY

## 2019-05-14 NOTE — Anesthesia Postprocedure Evaluation (Signed)
Anesthesia Post Note  Patient: Erica Wells  Procedure(s) Performed: LAPAROSCOPIC SUPRACERVICAL HYSTERECTOMY, Bilateral Salpingectomy (N/A )  Patient location during evaluation: PACU Anesthesia Type: General Level of consciousness: awake and alert Pain management: pain level controlled Vital Signs Assessment: post-procedure vital signs reviewed and stable Respiratory status: spontaneous breathing, nonlabored ventilation, respiratory function stable and patient connected to nasal cannula oxygen Cardiovascular status: blood pressure returned to baseline and stable Postop Assessment: no apparent nausea or vomiting Anesthetic complications: no     Last Vitals:  Vitals:   05/09/19 1004 05/09/19 1123  BP: 126/82 133/89  Pulse: 88 90  Resp: 18 18  Temp: 36.5 C   SpO2: 100% 100%    Last Pain:  Vitals:   05/12/19 0853  TempSrc:   PainSc: 2                  Alphonsus Sias

## 2020-03-31 ENCOUNTER — Other Ambulatory Visit: Payer: Self-pay

## 2020-03-31 ENCOUNTER — Ambulatory Visit: Payer: Managed Care, Other (non HMO) | Admitting: Family Medicine

## 2020-03-31 ENCOUNTER — Encounter: Payer: Self-pay | Admitting: Family Medicine

## 2020-03-31 VITALS — BP 108/62 | HR 84 | Ht 68.75 in | Wt 148.0 lb

## 2020-03-31 DIAGNOSIS — G43009 Migraine without aura, not intractable, without status migrainosus: Secondary | ICD-10-CM

## 2020-03-31 DIAGNOSIS — G43909 Migraine, unspecified, not intractable, without status migrainosus: Secondary | ICD-10-CM | POA: Insufficient documentation

## 2020-03-31 DIAGNOSIS — F902 Attention-deficit hyperactivity disorder, combined type: Secondary | ICD-10-CM | POA: Diagnosis not present

## 2020-03-31 DIAGNOSIS — F411 Generalized anxiety disorder: Secondary | ICD-10-CM

## 2020-03-31 DIAGNOSIS — L700 Acne vulgaris: Secondary | ICD-10-CM | POA: Diagnosis not present

## 2020-03-31 MED ORDER — TRETINOIN 0.025 % EX GEL: Freq: Every day | CUTANEOUS | 0 refills | Status: DC

## 2020-03-31 MED ORDER — DOXYCYCLINE HYCLATE 100 MG PO TABS: 100.0000 mg | ORAL_TABLET | Freq: Every day | ORAL | 3 refills | Status: DC

## 2020-03-31 MED ORDER — AMITRIPTYLINE HCL 10 MG PO TABS: 30.0000 mg | ORAL_TABLET | Freq: Every day | ORAL | 3 refills | Status: DC

## 2020-03-31 NOTE — Patient Instructions (Addendum)
Return for controlled substance contract and refill visit  Start this routine for your Acne management  Morning:  1. Wash face with gentle cleanser 2. Apply a thin layer of benzoyl peroxide   Night:  1. Wash face with gentle cleanser 2. Apply a thin layer of tretinoin ([employer], Retin-A) to entire face  It may take up to 8 weeks to notice a difference, so stick with it!  Dry Skin: choose creams or moisturizers Oily Skin: Try a gel Acne under hair: try foams

## 2020-03-31 NOTE — Assessment & Plan Note (Signed)
Controlled on current medication. Discussed that controlled substances cannot be filled a new patient visits. She will return for follow-up.

## 2020-03-31 NOTE — Assessment & Plan Note (Signed)
Stable on topiramate. Cont prn maxalt.

## 2020-03-31 NOTE — Assessment & Plan Note (Signed)
Stable on current regimen. Has clonazepam #60 which was filled 16 months ago and still has some left - rare use. Cont zoloft. Follow-up prn if symptoms worsen.

## 2020-03-31 NOTE — Progress Notes (Signed)
Subjective:     Erica Wells is a 39 y.o. female presenting for New Patient (Initial Visit)     HPI  #ADHD - adderall 20 mg - has been on the medication for 3 years - was diagnosed by her primary care provider - has seen psychologist since  - thinks she had symptoms as a child that were never treated - has 3 pills left - skips weekends unless she knows she is going to around more people - helps filter things  #migraines - well controlled - gets nausea   #Anxiety - anticipating a divorce soon - not currently seeing a therapist - became too expensive - feeling ok currently on zoloft, but anticipating that this could worsening - occasionally takes Clonazepam 60 tablets have lasted almost 1.5 years - medication is controlling well enough  #Acne - is using cream - has been on topical antibiotics in the past w/o improvement   Review of Systems   Social History   Tobacco Use  Smoking Status Never Smoker  Smokeless Tobacco Never Used        Objective:    BP Readings from Last 3 Encounters:  03/31/20 108/62  05/09/19 133/89  09/06/18 124/84   Wt Readings from Last 3 Encounters:  03/31/20 148 lb (67.1 kg)  05/09/19 210 lb (95.3 kg)  05/05/19 210 lb (95.3 kg)    BP 108/62 (BP Location: Left Arm, Patient Position: Sitting, Cuff Size: Normal)   Pulse 84   Ht 5' 8.75" (1.746 m)   Wt 148 lb (67.1 kg)   SpO2 97%   BMI 22.02 kg/m    Physical Exam Constitutional:      General: She is not in acute distress.    Appearance: She is well-developed. She is not diaphoretic.  HENT:     Right Ear: External ear normal.     Left Ear: External ear normal.     Nose: Nose normal.  Eyes:     Conjunctiva/sclera: Conjunctivae normal.  Cardiovascular:     Rate and Rhythm: Normal rate.  Pulmonary:     Effort: Pulmonary effort is normal.  Musculoskeletal:     Cervical back: Neck supple.  Skin:    General: Skin is warm and dry.     Capillary Refill: Capillary  refill takes less than 2 seconds.     Comments: Diffuse facial acne with comedons and scaring   Neurological:     Mental Status: She is alert. Mental status is at baseline.  Psychiatric:        Mood and Affect: Mood normal.        Behavior: Behavior normal.           Assessment & Plan:   Problem List Items Addressed This Visit      Cardiovascular and Mediastinum   Migraine    Stable on topiramate. Cont prn maxalt.       Relevant Medications   amitriptyline (ELAVIL) 10 MG tablet     Musculoskeletal and Integument   Acne vulgaris    Recurrent difficult to treat acne which worsened when she stopped birth control. Requested trial of antibiotic treatment given failure of topical will initiate oral abx trial of 12 weeks in addition to retin-A and benzoyl peroxide.       Relevant Medications   tretinoin (RETIN-A) 0.025 % gel   doxycycline (VIBRA-TABS) 100 MG tablet     Other   ADHD (attention deficit hyperactivity disorder), combined type    Controlled on current  medication. Discussed that controlled substances cannot be filled a new patient visits. She will return for follow-up.       Anxiety, generalized - Primary    Stable on current regimen. Has clonazepam #60 which was filled 16 months ago and still has some left - rare use. Cont zoloft. Follow-up prn if symptoms worsen.       Relevant Medications   amitriptyline (ELAVIL) 10 MG tablet       Return in about 1 day (around 04/01/2020).  Lesleigh Noe, MD

## 2020-03-31 NOTE — Assessment & Plan Note (Addendum)
Recurrent difficult to treat acne which worsened when she stopped birth control. Requested trial of antibiotic treatment given failure of topical will initiate oral abx trial of 12 weeks in addition to retin-A and benzoyl peroxide.

## 2020-04-01 ENCOUNTER — Ambulatory Visit: Payer: Managed Care, Other (non HMO) | Admitting: Family Medicine

## 2020-04-01 ENCOUNTER — Encounter: Payer: Self-pay | Admitting: Family Medicine

## 2020-04-01 VITALS — BP 102/58 | HR 87 | Ht 68.75 in | Wt 150.0 lb

## 2020-04-01 DIAGNOSIS — F411 Generalized anxiety disorder: Secondary | ICD-10-CM | POA: Diagnosis not present

## 2020-04-01 DIAGNOSIS — F902 Attention-deficit hyperactivity disorder, combined type: Secondary | ICD-10-CM | POA: Diagnosis not present

## 2020-04-01 MED ORDER — AMPHETAMINE-DEXTROAMPHET ER 20 MG PO CP24: 20.0000 mg | ORAL_CAPSULE | ORAL | 0 refills | Status: DC

## 2020-04-01 NOTE — Patient Instructions (Signed)
Thanks for coming back!  Medication refill today  Return in 3 months for refill. If you are do for an annual we could combine one our visits in the next year.

## 2020-04-01 NOTE — Assessment & Plan Note (Signed)
Stable. Contract to allow up to 60 tablets of Clonazepam per year. Refill when previous prescription is gone.

## 2020-04-01 NOTE — Assessment & Plan Note (Signed)
Doing well on current dose. Contract today. UDS today. Return 3 months

## 2020-04-01 NOTE — Progress Notes (Signed)
   Subjective:     Erica Wells is a 39 y.o. female presenting for Medication Management (adderall contract)     HPI  #ADHD - compliant with medications - will occasionally not take on the weekend - stable on current dose - had some inattention which is improved - when anxiety was treated her impulsive behavior came forward which is treated well with ADHD  #Anxiety - taking occasional clonazepam for severe symptoms  - has 60 tablets from 16 months ago and still has some remaining   Review of Systems   Social History   Tobacco Use  Smoking Status Never Smoker  Smokeless Tobacco Never Used        Objective:    BP Readings from Last 3 Encounters:  04/01/20 (!) 102/58  03/31/20 108/62  05/09/19 133/89   Wt Readings from Last 3 Encounters:  04/01/20 150 lb (68 kg)  03/31/20 148 lb (67.1 kg)  05/09/19 210 lb (95.3 kg)    BP (!) 102/58   Pulse 87   Ht 5' 8.75" (1.746 m)   Wt 150 lb (68 kg)   LMP 04/16/2019   SpO2 99%   BMI 22.31 kg/m    Physical Exam Constitutional:      General: She is not in acute distress.    Appearance: She is well-developed. She is not diaphoretic.  HENT:     Right Ear: External ear normal.     Left Ear: External ear normal.  Eyes:     Conjunctiva/sclera: Conjunctivae normal.  Cardiovascular:     Rate and Rhythm: Normal rate.  Pulmonary:     Effort: Pulmonary effort is normal.  Musculoskeletal:     Cervical back: Neck supple.  Skin:    General: Skin is warm and dry.     Capillary Refill: Capillary refill takes less than 2 seconds.  Neurological:     Mental Status: She is alert. Mental status is at baseline.  Psychiatric:        Mood and Affect: Mood normal.        Behavior: Behavior normal.           Assessment & Plan:   Problem List Items Addressed This Visit      Other   ADHD (attention deficit hyperactivity disorder), combined type - Primary    Doing well on current dose. Contract today. UDS today. Return  3 months      Relevant Medications   amphetamine-dextroamphetamine (ADDERALL XR) 20 MG 24 hr capsule   Other Relevant Orders   DRUG MONITORING, PANEL 8 WITH CONFIRMATION, URINE   Anxiety, generalized    Stable. Contract to allow up to 60 tablets of Clonazepam per year. Refill when previous prescription is gone.           Return in about 3 months (around 07/02/2020).  Lesleigh Noe, MD  This visit occurred during the SARS-CoV-2 public health emergency.  Safety protocols were in place, including screening questions prior to the visit, additional usage of staff PPE, and extensive cleaning of exam room while observing appropriate contact time as indicated for disinfecting solutions.

## 2020-04-04 LAB — DRUG MONITORING, PANEL 8 WITH CONFIRMATION, URINE
6 Acetylmorphine: NEGATIVE ng/mL (ref <10)
Alcohol Metabolites: POSITIVE ng/mL — AB
Amphetamine: 3229 ng/mL — ABNORMAL HIGH (ref <250)
Amphetamines: POSITIVE ng/mL — AB (ref <500)
Benzodiazepines: NEGATIVE ng/mL (ref <100)
Buprenorphine, Urine: NEGATIVE ng/mL (ref <5)
Cocaine Metabolite: NEGATIVE ng/mL (ref <150)
Creatinine: 66.7 mg/dL
Ethyl Glucuronide (ETG): 3658 ng/mL — ABNORMAL HIGH (ref <500)
Ethyl Sulfate (ETS): 722 ng/mL — ABNORMAL HIGH (ref <100)
MDMA: NEGATIVE ng/mL (ref <500)
Marijuana Metabolite: NEGATIVE ng/mL (ref <20)
Methamphetamine: NEGATIVE ng/mL (ref <250)
Opiates: NEGATIVE ng/mL (ref <100)
Oxidant: NEGATIVE ug/mL
Oxycodone: NEGATIVE ng/mL (ref <100)
pH: 7.1 (ref 4.5–9.0)

## 2020-04-04 LAB — DM TEMPLATE

## 2020-04-05 ENCOUNTER — Telehealth: Payer: Self-pay | Admitting: *Deleted

## 2020-04-05 NOTE — Telephone Encounter (Signed)
Received fax from [facility] requesting PA for Tretinoin 0.025% gel.  PA complete on CoverMyMeds and sent for review.  Can take up to 72 hours for a decision.

## 2020-04-06 NOTE — Telephone Encounter (Signed)
PA approved.  [facility] notified of approval via fax.

## 2020-04-10 ENCOUNTER — Encounter: Payer: Self-pay | Admitting: Family Medicine

## 2020-06-28 ENCOUNTER — Encounter: Payer: Self-pay | Admitting: Family Medicine

## 2020-06-28 ENCOUNTER — Ambulatory Visit: Payer: Managed Care, Other (non HMO) | Admitting: Family Medicine

## 2020-06-28 ENCOUNTER — Other Ambulatory Visit: Payer: Self-pay

## 2020-06-28 VITALS — BP 100/60 | HR 90 | Temp 98.0°F | Ht 69.0 in | Wt 147.5 lb

## 2020-06-28 DIAGNOSIS — F902 Attention-deficit hyperactivity disorder, combined type: Secondary | ICD-10-CM | POA: Diagnosis not present

## 2020-06-28 DIAGNOSIS — Z23 Encounter for immunization: Secondary | ICD-10-CM

## 2020-06-28 MED ORDER — AMPHETAMINE-DEXTROAMPHET ER 20 MG PO CP24: 20.0000 mg | ORAL_CAPSULE | Freq: Every day | ORAL | 0 refills | Status: DC

## 2020-06-28 NOTE — Assessment & Plan Note (Signed)
Doing well on current dose. Now taking on the weekends as she noticed she needed the medication to help with driving. Cont Adderall. Return 3 months

## 2020-06-28 NOTE — Progress Notes (Signed)
   Subjective:     Erica Wells is a 39 y.o. female presenting for Medication Refill (adderall )     HPI  #ADHD - feels it is working - doing well at work - focusing not an issue - has started taking it on the weekends again - difficulty driving if she doesn't take the medication -    Review of Systems   Social History   Tobacco Use  Smoking Status Never Smoker  Smokeless Tobacco Never Used        Objective:    BP Readings from Last 3 Encounters:  06/28/20 100/60  04/01/20 (!) 102/58  03/31/20 108/62   Wt Readings from Last 3 Encounters:  06/28/20 147 lb 8 oz (66.9 kg)  04/01/20 150 lb (68 kg)  03/31/20 148 lb (67.1 kg)    BP 100/60   Pulse 90   Temp 98 F (36.7 C) (Temporal)   Ht 5\' 9"  (1.753 m)   Wt 147 lb 8 oz (66.9 kg)   LMP 04/16/2019   SpO2 98%   BMI 21.78 kg/m    Physical Exam Constitutional:      General: She is not in acute distress.    Appearance: She is well-developed. She is not diaphoretic.  HENT:     Right Ear: External ear normal.     Left Ear: External ear normal.     Nose: Nose normal.  Eyes:     Conjunctiva/sclera: Conjunctivae normal.  Cardiovascular:     Rate and Rhythm: Normal rate.  Pulmonary:     Effort: Pulmonary effort is normal.  Musculoskeletal:     Cervical back: Neck supple.  Skin:    General: Skin is warm and dry.     Capillary Refill: Capillary refill takes less than 2 seconds.  Neurological:     Mental Status: She is alert. Mental status is at baseline.  Psychiatric:        Mood and Affect: Mood normal.        Behavior: Behavior normal.           Assessment & Plan:   Problem List Items Addressed This Visit      Other   ADHD (attention deficit hyperactivity disorder), combined type    Doing well on current dose. Now taking on the weekends as she noticed she needed the medication to help with driving. Cont Adderall. Return 3 months      Relevant Medications   amphetamine-dextroamphetamine  (ADDERALL XR) 20 MG 24 hr capsule   amphetamine-dextroamphetamine (ADDERALL XR) 20 MG 24 hr capsule (Start on 08/28/2020)   amphetamine-dextroamphetamine (ADDERALL XR) 20 MG 24 hr capsule (Start on 07/28/2020)    Other Visit Diagnoses    Need for influenza vaccination    -  Primary   Relevant Orders   Flu Vaccine QUAD 36+ mos IM       Return in about 3 months (around 09/27/2020).  Lesleigh Noe, MD  This visit occurred during the SARS-CoV-2 public health emergency.  Safety protocols were in place, including screening questions prior to the visit, additional usage of staff PPE, and extensive cleaning of exam room while observing appropriate contact time as indicated for disinfecting solutions.

## 2020-08-30 ENCOUNTER — Encounter: Payer: Self-pay | Admitting: Family Medicine

## 2020-08-30 ENCOUNTER — Other Ambulatory Visit: Payer: Self-pay

## 2020-08-30 MED ORDER — SERTRALINE HCL 100 MG PO TABS: 100.0000 mg | ORAL_TABLET | Freq: Every day | ORAL | 0 refills | Status: DC

## 2020-08-30 MED ORDER — LANSOPRAZOLE 15 MG PO CPDR: 15.0000 mg | DELAYED_RELEASE_CAPSULE | Freq: Every day | ORAL | 0 refills | Status: DC

## 2020-09-20 ENCOUNTER — Ambulatory Visit: Payer: Managed Care, Other (non HMO) | Admitting: Family Medicine

## 2020-09-20 ENCOUNTER — Encounter: Payer: Self-pay | Admitting: Family Medicine

## 2020-09-20 ENCOUNTER — Other Ambulatory Visit: Payer: Self-pay

## 2020-09-20 VITALS — BP 118/80 | HR 96 | Temp 97.7°F | Ht 69.0 in | Wt 159.5 lb

## 2020-09-20 DIAGNOSIS — F902 Attention-deficit hyperactivity disorder, combined type: Secondary | ICD-10-CM

## 2020-09-20 DIAGNOSIS — R239 Unspecified skin changes: Secondary | ICD-10-CM

## 2020-09-20 MED ORDER — AMPHETAMINE-DEXTROAMPHET ER 20 MG PO CP24: 20.0000 mg | ORAL_CAPSULE | Freq: Every day | ORAL | 0 refills | Status: DC

## 2020-09-20 MED ORDER — AMPHETAMINE-DEXTROAMPHET ER 20 MG PO CP24: 20.0000 mg | ORAL_CAPSULE | ORAL | 0 refills | Status: DC

## 2020-09-20 NOTE — Assessment & Plan Note (Signed)
Doing well. Continue current medication

## 2020-09-20 NOTE — Patient Instructions (Signed)
Tick bite - try Hydrocortisone cream, twice daily for 2-3 weeks - can try higher dose if not completely clearing

## 2020-09-20 NOTE — Assessment & Plan Note (Signed)
Pt notes incomplete resolution of tick bite. Trial of hydrocortisone cream since it is still itchy.

## 2020-09-20 NOTE — Progress Notes (Signed)
   Subjective:     Erica Wells is a 39 y.o. female presenting for Follow-up (ADHD)     HPI  #ADHD - still doing well on current dose  #prior tick bite - still itching - was 8 months ago - seems like it won't go away - treatment - eczema creams   Review of Systems   Social History   Tobacco Use  Smoking Status Never Smoker  Smokeless Tobacco Never Used        Objective:    BP Readings from Last 3 Encounters:  09/20/20 118/80  06/28/20 100/60  04/01/20 (!) 102/58   Wt Readings from Last 3 Encounters:  09/20/20 159 lb 8 oz (72.3 kg)  06/28/20 147 lb 8 oz (66.9 kg)  04/01/20 150 lb (68 kg)    BP 118/80   Pulse 96   Temp 97.7 F (36.5 C) (Temporal)   Ht 5\' 9"  (1.753 m)   Wt 159 lb 8 oz (72.3 kg)   LMP 04/16/2019   SpO2 99%   BMI 23.55 kg/m    Physical Exam Constitutional:      General: She is not in acute distress.    Appearance: She is well-developed. She is not diaphoretic.  HENT:     Right Ear: External ear normal.     Left Ear: External ear normal.     Nose: Nose normal.  Eyes:     Conjunctiva/sclera: Conjunctivae normal.  Cardiovascular:     Rate and Rhythm: Normal rate.  Pulmonary:     Effort: Pulmonary effort is normal.  Musculoskeletal:     Cervical back: Neck supple.  Skin:    General: Skin is warm and dry.     Capillary Refill: Capillary refill takes less than 2 seconds.     Comments: Small brown blanching macule on the left shin  Neurological:     Mental Status: She is alert. Mental status is at baseline.  Psychiatric:        Mood and Affect: Mood normal.        Behavior: Behavior normal.           Assessment & Plan:   Problem List Items Addressed This Visit      Other   ADHD (attention deficit hyperactivity disorder), combined type - Primary    Doing well. Continue current medication      Relevant Medications   amphetamine-dextroamphetamine (ADDERALL XR) 20 MG 24 hr capsule   Skin change    Pt notes  incomplete resolution of tick bite. Trial of hydrocortisone cream since it is still itchy.           Return in about 3 months (around 12/19/2020).  Lesleigh Noe, MD  This visit occurred during the SARS-CoV-2 public health emergency.  Safety protocols were in place, including screening questions prior to the visit, additional usage of staff PPE, and extensive cleaning of exam room while observing appropriate contact time as indicated for disinfecting solutions.

## 2020-10-16 ENCOUNTER — Other Ambulatory Visit: Payer: Self-pay | Admitting: Family Medicine

## 2020-10-31 ENCOUNTER — Other Ambulatory Visit: Payer: Self-pay | Admitting: Family Medicine

## 2020-12-14 ENCOUNTER — Encounter: Payer: Self-pay | Admitting: Family Medicine

## 2020-12-14 ENCOUNTER — Ambulatory Visit: Payer: Managed Care, Other (non HMO) | Admitting: Family Medicine

## 2020-12-14 ENCOUNTER — Other Ambulatory Visit: Payer: Self-pay

## 2020-12-14 VITALS — BP 118/70 | HR 82 | Temp 98.0°F | Ht 69.0 in | Wt 162.5 lb

## 2020-12-14 DIAGNOSIS — K219 Gastro-esophageal reflux disease without esophagitis: Secondary | ICD-10-CM | POA: Diagnosis not present

## 2020-12-14 DIAGNOSIS — F902 Attention-deficit hyperactivity disorder, combined type: Secondary | ICD-10-CM | POA: Diagnosis not present

## 2020-12-14 MED ORDER — FAMOTIDINE 40 MG PO TABS: 40.0000 mg | ORAL_TABLET | Freq: Every day | ORAL | 1 refills | Status: DC

## 2020-12-14 MED ORDER — AMPHETAMINE-DEXTROAMPHET ER 20 MG PO CP24: 20.0000 mg | ORAL_CAPSULE | ORAL | 0 refills | Status: DC

## 2020-12-14 NOTE — Patient Instructions (Signed)
Great to see you!  Continue medications

## 2020-12-14 NOTE — Assessment & Plan Note (Addendum)
Stable. Hx of seeing GI. Continue Pepcid. Still occasional reflux. Consider pantoprazole if not effective. GI referral if worsening as last EGD 2015 which was normal

## 2020-12-14 NOTE — Progress Notes (Signed)
Subjective:     Erica Wells is a 40 y.o. female presenting for Medication Refill     HPI   #ADHD - medication working well - doing well at work - has noticed it wears off towards the end the day - tried doing long and short acting - does have some short acting that she takes occasionally  - has enough right now because of such rare use    Review of Systems   Social History   Tobacco Use  Smoking Status Never Smoker  Smokeless Tobacco Never Used        Objective:    BP Readings from Last 3 Encounters:  12/14/20 118/70  09/20/20 118/80  06/28/20 100/60   Wt Readings from Last 3 Encounters:  12/14/20 162 lb 8 oz (73.7 kg)  09/20/20 159 lb 8 oz (72.3 kg)  06/28/20 147 lb 8 oz (66.9 kg)    BP 118/70   Pulse 82   Temp 98 F (36.7 C) (Temporal)   Ht 5\' 9"  (1.753 m)   Wt 162 lb 8 oz (73.7 kg)   LMP 04/16/2019   SpO2 96%   BMI 24.00 kg/m    Physical Exam Constitutional:      General: She is not in acute distress.    Appearance: She is well-developed. She is not diaphoretic.  HENT:     Right Ear: External ear normal.     Left Ear: External ear normal.  Eyes:     Conjunctiva/sclera: Conjunctivae normal.  Cardiovascular:     Rate and Rhythm: Normal rate.  Pulmonary:     Effort: Pulmonary effort is normal.  Musculoskeletal:     Cervical back: Neck supple.  Skin:    General: Skin is warm and dry.     Capillary Refill: Capillary refill takes less than 2 seconds.  Neurological:     Mental Status: She is alert. Mental status is at baseline.  Psychiatric:        Mood and Affect: Mood normal.        Behavior: Behavior normal.           Assessment & Plan:   Problem List Items Addressed This Visit      Digestive   GERD (gastroesophageal reflux disease) - Primary    Stable. Hx of seeing GI. Continue Pepcid. Still occasional reflux. Consider pantoprazole if not effective. GI referral if worsening as last EGD 2015 which was normal       Relevant Medications   famotidine (PEPCID) 40 MG tablet     Other   ADHD (attention deficit hyperactivity disorder), combined type    Doing well. Medication wears off at 6 hours but is typically able to get through the day. Tried short acting for rare use and still has some left. May plan for refill of #30 short acting if continuing to need for annual use. Cont Adderall XR 20 mg      Relevant Medications   amphetamine-dextroamphetamine (ADDERALL XR) 20 MG 24 hr capsule   amphetamine-dextroamphetamine (ADDERALL XR) 20 MG 24 hr capsule (Start on 02/13/2021)   amphetamine-dextroamphetamine (ADDERALL XR) 20 MG 24 hr capsule (Start on 01/14/2021)       Return in about 3 months (around 03/16/2021) for annual physical.  Lesleigh Noe, MD  This visit occurred during the SARS-CoV-2 public health emergency.  Safety protocols were in place, including screening questions prior to the visit, additional usage of staff PPE, and extensive cleaning of exam room while observing  appropriate contact time as indicated for disinfecting solutions.

## 2020-12-14 NOTE — Assessment & Plan Note (Signed)
Doing well. Medication wears off at 6 hours but is typically able to get through the day. Tried short acting for rare use and still has some left. May plan for refill of #30 short acting if continuing to need for annual use. Cont Adderall XR 20 mg

## 2020-12-22 ENCOUNTER — Encounter: Payer: Self-pay | Admitting: Family Medicine

## 2020-12-22 ENCOUNTER — Other Ambulatory Visit: Payer: Self-pay

## 2020-12-22 MED ORDER — TOPIRAMATE 50 MG PO TABS: 50.0000 mg | ORAL_TABLET | Freq: Two times a day (BID) | ORAL | 3 refills | Status: DC

## 2020-12-22 NOTE — Telephone Encounter (Signed)
Last OV: 12/14/20 Last refill: unknown. Last filled by historical provider.  Pt requests 90 day supply sent to [facility]

## 2021-03-21 ENCOUNTER — Other Ambulatory Visit: Payer: Self-pay

## 2021-03-21 ENCOUNTER — Encounter: Payer: Self-pay | Admitting: Family Medicine

## 2021-03-21 ENCOUNTER — Ambulatory Visit: Payer: Managed Care, Other (non HMO) | Admitting: Family Medicine

## 2021-03-21 DIAGNOSIS — F902 Attention-deficit hyperactivity disorder, combined type: Secondary | ICD-10-CM

## 2021-03-21 MED ORDER — AMPHETAMINE-DEXTROAMPHET ER 20 MG PO CP24: 20.0000 mg | ORAL_CAPSULE | ORAL | 0 refills | Status: DC

## 2021-03-21 MED ORDER — AMPHETAMINE-DEXTROAMPHET ER 20 MG PO CP24
20.0000 mg | ORAL_CAPSULE | ORAL | 0 refills | Status: DC
Start: 2021-03-21 — End: 2021-04-26

## 2021-03-21 NOTE — Patient Instructions (Signed)
Preventive Care 21-39 Years Old, Female Preventive care refers to lifestyle choices and visits with your health care provider that can promote health and wellness. This includes:  A yearly physical exam. This is also called an annual wellness visit.  Regular dental and eye exams.  Immunizations.  Screening for certain conditions.  Healthy lifestyle choices, such as: ? Eating a healthy diet. ? Getting regular exercise. ? Not using drugs or products that contain nicotine and tobacco. ? Limiting alcohol use. What can I expect for my preventive care visit? Physical exam Your health care provider may check your:  Height and weight. These may be used to calculate your BMI (body mass index). BMI is a measurement that tells if you are at a healthy weight.  Heart rate and blood pressure.  Body temperature.  Skin for abnormal spots. Counseling Your health care provider may ask you questions about your:  Past medical problems.  Family's medical history.  Alcohol, tobacco, and drug use.  Emotional well-being.  Home life and relationship well-being.  Sexual activity.  Diet, exercise, and sleep habits.  Work and work environment.  Access to firearms.  Method of birth control.  Menstrual cycle.  Pregnancy history. What immunizations do I need? Vaccines are usually given at various ages, according to a schedule. Your health care provider will recommend vaccines for you based on your age, medical history, and lifestyle or other factors, such as travel or where you work.   What tests do I need? Blood tests  Lipid and cholesterol levels. These may be checked every 5 years starting at age 20.  Hepatitis C test.  Hepatitis B test. Screening  Diabetes screening. This is done by checking your blood sugar (glucose) after you have not eaten for a while (fasting).  STD (sexually transmitted disease) testing, if you are at risk.  BRCA-related cancer screening. This may be  done if you have a family history of breast, ovarian, tubal, or peritoneal cancers.  Pelvic exam and Pap test. This may be done every 3 years starting at age 21. Starting at age 30, this may be done every 5 years if you have a Pap test in combination with an HPV test. Talk with your health care provider about your test results, treatment options, and if necessary, the need for more tests.   Follow these instructions at home: Eating and drinking  Eat a healthy diet that includes fresh fruits and vegetables, whole grains, lean protein, and low-fat dairy products.  Take vitamin and mineral supplements as recommended by your health care provider.  Do not drink alcohol if: ? Your health care provider tells you not to drink. ? You are pregnant, may be pregnant, or are planning to become pregnant.  If you drink alcohol: ? Limit how much you have to 0-1 drink a day. ? Be aware of how much alcohol is in your drink. In the [place] one drink equals one 12 oz bottle of beer (355 mL), one 5 oz glass of wine (148 mL), or one 1 oz glass of hard liquor (44 mL).   Lifestyle  Take daily care of your teeth and gums. Brush your teeth every morning and night with fluoride toothpaste. Floss one time each day.  Stay active. Exercise for at least 30 minutes 5 or more days each week.  Do not use any products that contain nicotine or tobacco, such as cigarettes, e-cigarettes, and chewing tobacco. If you need help quitting, ask your health care provider.  Do not   use drugs.  If you are sexually active, practice safe sex. Use a condom or other form of protection to prevent STIs (sexually transmitted infections).  If you do not wish to become pregnant, use a form of birth control. If you plan to become pregnant, see your health care provider for a prepregnancy visit.  Find healthy ways to cope with stress, such as: ? Meditation, yoga, or listening to music. ? Journaling. ? Talking to a trusted  person. ? Spending time with friends and family. Safety  Always wear your seat belt while driving or riding in a vehicle.  Do not drive: ? If you have been drinking alcohol. Do not ride with someone who has been drinking. ? When you are tired or distracted. ? While texting.  Wear a helmet and other protective equipment during sports activities.  If you have firearms in your house, make sure you follow all gun safety procedures.  Seek help if you have been physically or sexually abused. What's next?  Go to your health care provider once a year for an annual wellness visit.  Ask your health care provider how often you should have your eyes and teeth checked.  Stay up to date on all vaccines. This information is not intended to replace advice given to you by your health care provider. Make sure you discuss any questions you have with your health care provider. Document Revised: 05/30/2020 Document Reviewed: 06/13/2018 Elsevier Patient Education  2021 Elsevier Inc.  

## 2021-03-21 NOTE — Progress Notes (Signed)
Annual Exam   Chief Complaint:  Chief Complaint  Patient presents with  . Annual Exam    History of Present Illness:  [name] is a 40 y.o. No obstetric history on file. who LMP was Patient's last menstrual period was 04/16/2019., presents today for her annual examination.    #ADHD - doing well with her medication - no concerns  Nutrition/Lifestyle Diet: no longer dieting - eating normal diet, healthier meals Exercise: hiking  She does get adequate calcium and Vitamin D in her diet.  Social History   Tobacco Use  Smoking Status Never Smoker  Smokeless Tobacco Never Used   Social History   Substance and Sexual Activity  Alcohol Use Yes  . Alcohol/week: 3.0 standard drinks  . Types: 3 Cans of beer per week   Comment: 4 times a week, 1-2 beers   Social History   Substance and Sexual Activity  Drug Use No     Safety The patient wears seatbelts: yes.     The patient feels safe at home and in their relationships: yes.  General Health Dentist in the last year: Yes Eye doctor: yes  Menstrual S/p hysterectomy   GYN She is single partner, contraception - status post hysterectomy.     Cervical Cancer Screening (Age 39-65) Last Pap:  July 2019 Results were: no abnormalities with HPV will look for this  Family History of Breast Cancer: no Family History of Ovarian Cancer: no    Weight Wt Readings from Last 3 Encounters:  03/21/21 166 lb (75.3 kg)  12/14/20 162 lb 8 oz (73.7 kg)  09/20/20 159 lb 8 oz (72.3 kg)   Patient has normal BMI  BMI Readings from Last 1 Encounters:  03/21/21 24.87 kg/m     Chronic disease screening Blood pressure monitoring:  BP Readings from Last 3 Encounters:  03/21/21 126/80  12/14/20 118/70  09/20/20 118/80     Lipid Monitoring: Indication for screening: age >48, obesity, diabetes, family hx, CV risk factors.  Lipid screening: Yes  No results found for: CHOL, HDL, LDLCALC, LDLDIRECT, TRIG,  CHOLHDL   Diabetes Screening: age >35, overweight, family hx, PCOS, hx of gestational diabetes, at risk ethnicity, elevated blood pressure >135/80.  Diabetes Screening screening: Yes  No results found for: HGBA1C    Past Medical History:  Diagnosis Date  . Anemia    YEARS AGO  . Anxiety   . ASCUS of cervix with negative high risk HPV 06/27/2016   Formatting of this note might be different from the original. Negative HPV 2015 and 2016 Normal Pap 2016 S/p hysterectomy 2020  . Complication of anesthesia   . GERD (gastroesophageal reflux disease)   . PONV (postoperative nausea and vomiting)     Past Surgical History:  Procedure Laterality Date  . CHOLECYSTECTOMY  2014  . DILATATION & CURETTAGE/HYSTEROSCOPY WITH MYOSURE N/A 09/06/2018   Procedure: DILATATION & CURETTAGE/HYSTEROSCOPY WITH MYOSURE-resection of polyps,submucosal polyps;  Surgeon: Schermerhorn, Gwen Her, MD;  Location: [facility];  Service: Gynecology;  Laterality: N/A;  . HIP ARTHROSCOPY Right 12/03/2017   Procedure: ARTHROSCOPY HIP;  Surgeon: Leim Fabry, MD;  Location: [facility];  Service: Orthopedics;  Laterality: Right;  . LAPAROSCOPIC SUPRACERVICAL HYSTERECTOMY N/A 05/09/2019   Procedure: LAPAROSCOPIC SUPRACERVICAL HYSTERECTOMY, Bilateral Salpingectomy;  Surgeon: Schermerhorn, Gwen Her, MD;  Location: [facility];  Service: Gynecology;  Laterality: N/A;    Prior to Admission medications   Medication Sig Start Date End Date Taking? Authorizing Provider  amitriptyline (ELAVIL) 10 MG tablet  TAKE 3 TABLETS BY MOUTH AT  BEDTIME 10/18/20  Yes Lesleigh Noe, MD  amphetamine-dextroamphetamine (ADDERALL XR) 20 MG 24 hr capsule Take 1 capsule (20 mg total) by mouth every morning. 12/14/20  Yes Lesleigh Noe, MD  amphetamine-dextroamphetamine (ADDERALL XR) 20 MG 24 hr capsule Take 1 capsule (20 mg total) by mouth every morning. 02/13/21  Yes Lesleigh Noe, MD  amphetamine-dextroamphetamine (ADDERALL XR) 20 MG 24 hr capsule Take 1  capsule (20 mg total) by mouth every morning. 01/14/21  Yes Lesleigh Noe, MD  clonazePAM [name]) 1 MG tablet Take by mouth. 12/08/18  Yes [provider]  famotidine (PEPCID) 40 MG tablet Take 1 tablet (40 mg total) by mouth daily. 12/14/20  Yes Lesleigh Noe, MD  lansoprazole (PREVACID) 15 MG capsule TAKE 1 CAPSULE BY MOUTH AT  BEDTIME 11/01/20  Yes Lesleigh Noe, MD  rizatriptan (MAXALT) 10 MG tablet Take 10 mg by mouth as needed for migraine. May repeat in 2 hours if needed   Yes [provider]  sertraline (ZOLOFT) 100 MG tablet TAKE 1 TABLET BY MOUTH AT  BEDTIME 11/01/20  Yes Lesleigh Noe, MD  topiramate (TOPAMAX) 50 MG tablet Take 1 tablet (50 mg total) by mouth 2 (two) times daily. 12/22/20  Yes Lesleigh Noe, MD  tretinoin (RETIN-A) 0.025 % gel Apply topically at bedtime. 03/31/20  Yes Lesleigh Noe, MD    Allergies  Allergen Reactions  . Penicillins Anaphylaxis    Has patient had a PCN reaction causing immediate rash, facial/tongue/throat swelling, SOB or lightheadedness with hypotension: Yes Has patient had a PCN reaction causing severe rash involving mucus membranes or skin necrosis: No Has patient had a PCN reaction that required hospitalization: Yes Has patient had a PCN reaction occurring within the last 10 years: No If all of the above answers are "NO", then may proceed with Cephalosporin use.     Gynecologic History: Patient's last menstrual period was 04/16/2019.  Obstetric History: No obstetric history on file.  Social History   Socioeconomic History  . Marital status: Married    Spouse name: Marcello Moores  . Number of children: 0  . Years of education: Bachelors degree  . Highest education level: Not on file  Occupational History  . Not on file  Tobacco Use  . Smoking status: Never Smoker  . Smokeless tobacco: Never Used  Vaping Use  . Vaping Use: Never used  Substance and Sexual Activity  . Alcohol use: Yes    Alcohol/week: 3.0 standard  drinks    Types: 3 Cans of beer per week    Comment: 4 times a week, 1-2 beers  . Drug use: No  . Sexual activity: Yes    Birth control/protection: Surgical    Comment: one partner  Other Topics Concern  . 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      Family: good relationship with family - most of her family is in [place]      Enjoys: hiking, pets (25 chickens - gives away eggs, 3 ducks)      Exercise: hiking 4 times a week 3-4 mile hike   Diet: weight watchers diet - using the app       Safety   Seat belts: Yes    Guns: Yes  and secure   Safe in relationships: Yes  Social Determinants of Health   Financial Resource Strain: Not on file  Food Insecurity: Not on file  Transportation Needs: Not on file  Physical Activity: Not on file  Stress: Not on file  Social Connections: Not on file  Intimate Partner Violence: Not on file    Family History  Problem Relation Age of Onset  . Arthritis Mother   . Colon polyps Mother   . Depression Father   . Diabetes Father   . Heart disease Father   . Non-Hodgkin's lymphoma Father   . Alcohol abuse Paternal Grandmother   . Heart disease Paternal Grandmother   . Heart disease Maternal Grandmother   . Colon cancer Maternal Grandfather 13  . Diabetes Paternal Grandfather     Review of Systems  Constitutional: Negative for chills and fever.  HENT: Negative for congestion and sore throat.   Eyes: Negative for blurred vision and double vision.  Respiratory: Negative for shortness of breath.   Cardiovascular: Negative for chest pain.  Gastrointestinal: Negative for heartburn, nausea and vomiting.  Genitourinary: Negative.   Musculoskeletal: Negative.  Negative for myalgias.  Skin: Negative for rash.  Neurological: Negative for dizziness and headaches.  Endo/Heme/Allergies: Does not bruise/bleed easily.   Psychiatric/Behavioral: Negative for depression. The patient is not nervous/anxious.      Physical Exam BP 126/80   Pulse (!) 105   Temp 97.9 F (36.6 C) (Temporal)   Ht 5' 8.5" (1.74 m)   Wt 166 lb (75.3 kg)   LMP 04/16/2019   SpO2 99%   BMI 24.87 kg/m    BP Readings from Last 3 Encounters:  03/21/21 126/80  12/14/20 118/70  09/20/20 118/80    Wt Readings from Last 3 Encounters:  03/21/21 166 lb (75.3 kg)  12/14/20 162 lb 8 oz (73.7 kg)  09/20/20 159 lb 8 oz (72.3 kg)     Physical Exam Constitutional:      General: She is not in acute distress.    Appearance: She is well-developed. She is not diaphoretic.  HENT:     Head: Normocephalic and atraumatic.     Right Ear: External ear normal.     Left Ear: External ear normal.     Nose: Nose normal.  Eyes:     General: No scleral icterus.    Conjunctiva/sclera: Conjunctivae normal.  Cardiovascular:     Rate and Rhythm: Normal rate and regular rhythm.     Heart sounds: No murmur heard.   Pulmonary:     Effort: Pulmonary effort is normal. No respiratory distress.     Breath sounds: Normal breath sounds. No wheezing.  Abdominal:     General: Bowel sounds are normal. There is no distension.     Palpations: Abdomen is soft. There is no mass.     Tenderness: There is no abdominal tenderness. There is no guarding or rebound.  Musculoskeletal:        General: Normal range of motion.     Cervical back: Neck supple.  Lymphadenopathy:     Cervical: No cervical adenopathy.  Skin:    General: Skin is warm and dry.     Capillary Refill: Capillary refill takes less than 2 seconds.     Comments: Skin tag on left axilla  Neurological:     Mental Status: She is alert and oriented to person, place, and time.     Deep Tendon Reflexes: Reflexes normal.  Psychiatric:        Behavior: Behavior normal.  Results: Depression screen [employer] 2/9 06/28/2020  Decreased Interest 0  Down, Depressed, Hopeless 1  PHQ - 2 Score 1   Altered sleeping 1  Tired, decreased energy 1  Change in appetite 0  Feeling bad or failure about yourself  0  Trouble concentrating 0  Moving slowly or fidgety/restless 0  Suicidal thoughts 0  PHQ-9 Score 3      Assessment: 40 y.o. No obstetric history on file. female here for routine annual examination.  Plan: Problem List Items Addressed This Visit   None      Screening: -- Blood pressure screen normal -- cholesterol screening: will get with work -- Weight screening: normal -- Diabetes Screening: not due for screening -- Nutrition: encouraged healthy diet   Psych -- Depression screening (PHQ-9):  [place] Office Visit from 06/28/2020 in [place] at [facility]  PHQ-9 Total Score 3       Safety -- tobacco screening: not using -- alcohol screening:  low-risk usage. -- no evidence of domestic violence or intimate partner violence.   Cancer Screening -- pap smear not collected per ASCCP guidelines -- family history of breast cancer screening: done. not at high risk.   Immunizations Immunization History  Administered Date(s) Administered  . Influenza,inj,Quad PF,6+ Mos 07/23/2017, 10/28/2018, 08/04/2019, 06/28/2020  . Influenza-Unspecified 10/28/2018  . PFIZER(Purple Top)SARS-COV-2 Vaccination 11/06/2019, 11/27/2019, 08/23/2020  . PPD Test 02/04/2014  . Tdap 04/14/2019    -- flu vaccine up to date -- TDAP q10 years up to date -- Covid-19 Vaccine up to date  Encouraged regular vision and dental screening. Encouraged healthy exercise and diet.   Lesleigh Noe

## 2021-04-12 ENCOUNTER — Encounter: Payer: Self-pay | Admitting: Family Medicine

## 2021-04-26 ENCOUNTER — Other Ambulatory Visit: Payer: Self-pay

## 2021-04-26 DIAGNOSIS — F902 Attention-deficit hyperactivity disorder, combined type: Secondary | ICD-10-CM

## 2021-04-26 MED ORDER — AMPHETAMINE-DEXTROAMPHET ER 20 MG PO CP24: 20.0000 mg | ORAL_CAPSULE | ORAL | 0 refills | Status: DC

## 2021-04-26 NOTE — Telephone Encounter (Signed)
Pt needed these sent to [facility] instead of [facility].

## 2021-04-28 LAB — LIPID PANEL
Cholesterol: 227 — AB (ref 0–200)
HDL: 63 (ref 35–70)
LDL Cholesterol: 140
LDl/HDL Ratio: 24
Triglycerides: 135 (ref 40–160)

## 2021-04-28 LAB — HEMOGLOBIN A1C: Hemoglobin A1C: 5.1

## 2021-04-28 LAB — BASIC METABOLIC PANEL
Creatinine: 0.7 (ref 0.5–1.1)
Glucose: 83

## 2021-04-28 LAB — COMPREHENSIVE METABOLIC PANEL: GFR calc non Af Amer: 107

## 2021-05-09 ENCOUNTER — Encounter: Payer: Self-pay | Admitting: Family Medicine

## 2021-05-20 ENCOUNTER — Other Ambulatory Visit: Payer: Self-pay | Admitting: Family Medicine

## 2021-06-19 ENCOUNTER — Other Ambulatory Visit: Payer: Self-pay | Admitting: Family Medicine

## 2021-06-19 DIAGNOSIS — K219 Gastro-esophageal reflux disease without esophagitis: Secondary | ICD-10-CM

## 2021-07-10 ENCOUNTER — Other Ambulatory Visit: Payer: Self-pay | Admitting: Family Medicine

## 2021-07-11 ENCOUNTER — Other Ambulatory Visit: Payer: Self-pay

## 2021-07-11 ENCOUNTER — Ambulatory Visit: Payer: Managed Care, Other (non HMO) | Admitting: Family Medicine

## 2021-07-11 ENCOUNTER — Encounter: Payer: Self-pay | Admitting: Family Medicine

## 2021-07-11 VITALS — BP 110/78 | HR 96 | Ht 68.5 in | Wt 181.0 lb

## 2021-07-11 DIAGNOSIS — F902 Attention-deficit hyperactivity disorder, combined type: Secondary | ICD-10-CM | POA: Diagnosis not present

## 2021-07-11 DIAGNOSIS — E663 Overweight: Secondary | ICD-10-CM | POA: Diagnosis not present

## 2021-07-11 MED ORDER — AMPHETAMINE-DEXTROAMPHET ER 20 MG PO CP24: 20.0000 mg | ORAL_CAPSULE | ORAL | 0 refills | Status: DC

## 2021-07-11 NOTE — Assessment & Plan Note (Signed)
Weight up in setting of stopping diet. Discussed continuing healthy eating and regular exercise and avoiding additional weight gain.

## 2021-07-11 NOTE — Progress Notes (Signed)
Subjective:     Erica Wells is a 40 y.o. female presenting for ADHD (3 Month Follow-up)     HPI  #ADHD - has been having issues getting medication through mail  - did not get one script through her mail order - would like switch to a local [facility] - sleep is hard on the weekends - on nightshift - has a hard time with transitioning   Weight is up - stopped being on a diet  Has noticed her bp has been high occasionally  - covid in august - took pseudoephedrine with higher bp   Review of Systems   Social History   Tobacco Use  Smoking Status Never  Smokeless Tobacco Never        Objective:    BP Readings from Last 3 Encounters:  07/11/21 110/78  03/21/21 126/80  12/14/20 118/70   Wt Readings from Last 3 Encounters:  07/11/21 181 lb (82.1 kg)  03/21/21 166 lb (75.3 kg)  12/14/20 162 lb 8 oz (73.7 kg)    BP 110/78 (BP Location: Left Arm, Patient Position: Sitting, Cuff Size: Normal)   Pulse 96   Ht 5' 8.5" (1.74 m)   Wt 181 lb (82.1 kg)   LMP 04/16/2019   SpO2 99%   BMI 27.12 kg/m    Physical Exam Constitutional:      General: She is not in acute distress.    Appearance: She is well-developed. She is not diaphoretic.  HENT:     Right Ear: External ear normal.     Left Ear: External ear normal.  Eyes:     Conjunctiva/sclera: Conjunctivae normal.  Cardiovascular:     Rate and Rhythm: Normal rate and regular rhythm.     Heart sounds: No murmur heard. Pulmonary:     Effort: Pulmonary effort is normal. No respiratory distress.     Breath sounds: Normal breath sounds. No wheezing.  Musculoskeletal:     Cervical back: Neck supple.  Skin:    General: Skin is warm and dry.     Capillary Refill: Capillary refill takes less than 2 seconds.  Neurological:     Mental Status: She is alert. Mental status is at baseline.  Psychiatric:        Mood and Affect: Mood normal.        Behavior: Behavior normal.          Assessment & Plan:    Problem List Items Addressed This Visit       Other   ADHD (attention deficit hyperactivity disorder), combined type - Primary    Doing well. Cont Adderall Xr 20 mg daily. Return for follow-up. Discussed pt noted higher bp - but normal today. Will continue to monitor and consider medication changes or labs if needed.       Relevant Medications   amphetamine-dextroamphetamine (ADDERALL XR) 20 MG 24 hr capsule   amphetamine-dextroamphetamine (ADDERALL XR) 20 MG 24 hr capsule (Start on 09/07/2021)   amphetamine-dextroamphetamine (ADDERALL XR) 20 MG 24 hr capsule (Start on 10/07/2021)   amphetamine-dextroamphetamine (ADDERALL XR) 20 MG 24 hr capsule   Overweight (BMI 25.0-29.9)    Weight up in setting of stopping diet. Discussed continuing healthy eating and regular exercise and avoiding additional weight gain.         Return in about 5 months (around 12/11/2021) for ADHD.  Lesleigh Noe, MD  This visit occurred during the SARS-CoV-2 public health emergency.  Safety protocols were in place, including screening questions prior  to the visit, additional usage of staff PPE, and extensive cleaning of exam room while observing appropriate contact time as indicated for disinfecting solutions.

## 2021-07-11 NOTE — Assessment & Plan Note (Signed)
Doing well. Cont Adderall Xr 20 mg daily. Return for follow-up. Discussed pt noted higher bp - but normal today. Will continue to monitor and consider medication changes or labs if needed.

## 2021-07-21 ENCOUNTER — Other Ambulatory Visit: Payer: Self-pay | Admitting: Family Medicine

## 2021-10-19 ENCOUNTER — Other Ambulatory Visit: Payer: Self-pay | Admitting: Family Medicine

## 2021-11-21 ENCOUNTER — Encounter: Payer: Self-pay | Admitting: Family Medicine

## 2021-11-21 DIAGNOSIS — F902 Attention-deficit hyperactivity disorder, combined type: Secondary | ICD-10-CM

## 2021-11-21 MED ORDER — AMPHETAMINE-DEXTROAMPHET ER 20 MG PO CP24: 20.0000 mg | ORAL_CAPSULE | ORAL | 0 refills | Status: DC

## 2021-11-21 NOTE — Telephone Encounter (Signed)
Refill(s) sent to pharmacy. No suspicious activity on PMP aware site.

## 2021-11-21 NOTE — Telephone Encounter (Signed)
Last seen in office on 07/11/2022 for ADHD follow up. Has follow up on 11/28/2021. Last refill 10/07/2021

## 2021-11-28 ENCOUNTER — Ambulatory Visit: Payer: Managed Care, Other (non HMO) | Admitting: Family Medicine

## 2021-11-28 ENCOUNTER — Other Ambulatory Visit: Payer: Self-pay

## 2021-11-28 VITALS — BP 110/80 | HR 104 | Temp 98.2°F | Ht 68.5 in | Wt 198.2 lb

## 2021-11-28 DIAGNOSIS — N809 Endometriosis, unspecified: Secondary | ICD-10-CM | POA: Diagnosis not present

## 2021-11-28 DIAGNOSIS — F902 Attention-deficit hyperactivity disorder, combined type: Secondary | ICD-10-CM

## 2021-11-28 DIAGNOSIS — E663 Overweight: Secondary | ICD-10-CM

## 2021-11-28 DIAGNOSIS — K219 Gastro-esophageal reflux disease without esophagitis: Secondary | ICD-10-CM | POA: Diagnosis not present

## 2021-11-28 MED ORDER — AMPHETAMINE-DEXTROAMPHET ER 20 MG PO CP24: 20.0000 mg | ORAL_CAPSULE | ORAL | 0 refills | Status: DC

## 2021-11-28 MED ORDER — AMITRIPTYLINE HCL 10 MG PO TABS: 30.0000 mg | ORAL_TABLET | Freq: Every day | ORAL | 1 refills | Status: DC

## 2021-11-28 MED ORDER — FAMOTIDINE 40 MG PO TABS: 40.0000 mg | ORAL_TABLET | Freq: Every day | ORAL | 3 refills | Status: DC

## 2021-11-28 NOTE — Assessment & Plan Note (Signed)
S/p hysterectomy 2/2 to endometriosis now with intermittent severe abdominal cramping monthly during cycle. She is not interested in OCPs at this time and is managing with ibuprofen/tylenol. Referral to GYN to see if any other options are available.

## 2021-11-28 NOTE — Assessment & Plan Note (Signed)
A few years since seeing GI. Stable on prevacid and pepcid. Continue both.

## 2021-11-28 NOTE — Progress Notes (Signed)
Subjective:     Erica Wells is a 41 y.o. female presenting for Medication Refill (Adderall gets sent to [facility] on [address]. [facility])     Medication Refill     #ADHD - continues to well on her medication - taking amitriptyline to help with sleep   #weight gain - did stop her diet - was doing weight watchers but she stopped - has been trying to get back to modifying diet w/o the high restriction  - not currently exercising - life has been stressful   #endometriosis  - s/p hysterectomy - thinks she may have some on her ovaries - will get pain during her regular cycles - was on OCPs prior to her hysterectomy - still having ovarian pain - taking ibuprofen/tylenol - and only getting pain a couple of days a month -   Review of Systems   Social History   Tobacco Use  Smoking Status Never  Smokeless Tobacco Never        Objective:    BP Readings from Last 3 Encounters:  11/28/21 110/80  07/11/21 110/78  03/21/21 126/80   Wt Readings from Last 3 Encounters:  11/28/21 198 lb 3 oz (89.9 kg)  07/11/21 181 lb (82.1 kg)  03/21/21 166 lb (75.3 kg)    BP 110/80    Pulse (!) 104    Temp 98.2 F (36.8 C) (Oral)    Ht 5' 8.5" (1.74 m)    Wt 198 lb 3 oz (89.9 kg)    LMP 04/16/2019    SpO2 99%    BMI 29.70 kg/m    Physical Exam Constitutional:      General: She is not in acute distress.    Appearance: She is well-developed. She is not diaphoretic.  HENT:     Right Ear: External ear normal.     Left Ear: External ear normal.  Eyes:     Conjunctiva/sclera: Conjunctivae normal.  Cardiovascular:     Rate and Rhythm: Normal rate and regular rhythm.     Heart sounds: No murmur heard. Pulmonary:     Effort: Pulmonary effort is normal. No respiratory distress.     Breath sounds: Normal breath sounds. No wheezing.  Musculoskeletal:     Cervical back: Neck supple.  Skin:    General: Skin is warm and dry.     Capillary Refill: Capillary  refill takes less than 2 seconds.  Neurological:     Mental Status: She is alert. Mental status is at baseline.  Psychiatric:        Mood and Affect: Mood normal.        Behavior: Behavior normal.          Assessment & Plan:   Problem List Items Addressed This Visit       Digestive   GERD (gastroesophageal reflux disease)    A few years since seeing GI. Stable on prevacid and pepcid. Continue both.       Relevant Medications   famotidine (PEPCID) 40 MG tablet     Other   ADHD (attention deficit hyperactivity disorder), combined type - Primary    Doing well. Continue Adderall XR 20 mg daily      Relevant Medications   amphetamine-dextroamphetamine (ADDERALL XR) 20 MG 24 hr capsule   amphetamine-dextroamphetamine (ADDERALL XR) 20 MG 24 hr capsule (Start on 12/26/2021)   amphetamine-dextroamphetamine (ADDERALL XR) 20 MG 24 hr capsule (Start on 01/26/2022)   Overweight (BMI 25.0-29.9)  Weight continues to go up. She is not longer on calorie restricted diet. Encouraged healthy eating and regular exercise. She is working on diet.       Endometriosis    S/p hysterectomy 2/2 to endometriosis now with intermittent severe abdominal cramping monthly during cycle. She is not interested in OCPs at this time and is managing with ibuprofen/tylenol. Referral to GYN to see if any other options are available.       Relevant Orders   Ambulatory referral to Obstetrics / Gynecology     Return in about 3 months (around 02/25/2022) for annual and med refill .  Lesleigh Noe, MD  This visit occurred during the SARS-CoV-2 public health emergency.  Safety protocols were in place, including screening questions prior to the visit, additional usage of staff PPE, and extensive cleaning of exam room while observing appropriate contact time as indicated for disinfecting solutions.

## 2021-11-28 NOTE — Assessment & Plan Note (Signed)
Doing well. Continue Adderall XR 20 mg daily

## 2021-11-28 NOTE — Assessment & Plan Note (Signed)
Weight continues to go up. She is not longer on calorie restricted diet. Encouraged healthy eating and regular exercise. She is working on diet.

## 2021-12-13 ENCOUNTER — Telehealth: Payer: Self-pay

## 2021-12-13 NOTE — Telephone Encounter (Signed)
Lmovm for the pt to call back to schedule the appt from her referral

## 2021-12-27 ENCOUNTER — Other Ambulatory Visit: Payer: Self-pay | Admitting: Family Medicine

## 2021-12-30 ENCOUNTER — Encounter: Payer: Self-pay | Admitting: Family Medicine

## 2021-12-30 DIAGNOSIS — F902 Attention-deficit hyperactivity disorder, combined type: Secondary | ICD-10-CM

## 2021-12-30 MED ORDER — AMPHETAMINE-DEXTROAMPHET ER 20 MG PO CP24: 20.0000 mg | ORAL_CAPSULE | ORAL | 0 refills | Status: DC

## 2022-02-01 ENCOUNTER — Telehealth: Payer: Self-pay

## 2022-02-01 NOTE — Telephone Encounter (Signed)
Left message for pt to call office back regarding referral that was sent to [facility].  

## 2022-02-03 ENCOUNTER — Encounter: Payer: Self-pay | Admitting: Family Medicine

## 2022-02-14 ENCOUNTER — Telehealth: Payer: Self-pay

## 2022-02-14 NOTE — Telephone Encounter (Signed)
Left message for pt to call the office back regarding referral placed to [facility]. ?

## 2022-03-21 ENCOUNTER — Ambulatory Visit: Payer: Managed Care, Other (non HMO) | Admitting: Obstetrics and Gynecology

## 2022-03-21 ENCOUNTER — Encounter: Payer: Self-pay | Admitting: Obstetrics and Gynecology

## 2022-03-21 VITALS — BP 120/81 | HR 101 | Ht 68.0 in | Wt 200.0 lb

## 2022-03-21 DIAGNOSIS — N809 Endometriosis, unspecified: Secondary | ICD-10-CM | POA: Diagnosis not present

## 2022-03-21 DIAGNOSIS — R102 Pelvic and perineal pain: Secondary | ICD-10-CM

## 2022-03-21 MED ORDER — SLYND 4 MG PO TABS: 1.0000 | ORAL_TABLET | Freq: Every day | ORAL | 3 refills | Status: DC

## 2022-03-21 NOTE — Progress Notes (Signed)
New Patient referral here to discuss endometriosis.  Pt had HYST notes pain every 30-60 days. Pt believes pain is where ovaries are and pain is around ovulation time. Pain last 1-2 days.  Pain may 4/10x  pain has been 6/10x  Pt states before surgery she never had this issue.

## 2022-03-21 NOTE — Progress Notes (Signed)
Obstetrics and Gynecology New Patient Evaluation  Appointment Date: 03/21/2022  OBGYN Clinic: [facility]  Primary Care Provider: Lesleigh Wells  Referring Provider: self  Chief Complaint: h/o endometriosis, pelvic pain with periods   History of Present Illness: Erica Wells is a 41 y.o. G0 (Patient's last menstrual period was 04/16/2019.), seen for the above chief complaint. Her past medical history is significant for h/o 2020 l/s [facility] with biopsy confirming endo.  Patient was on combined OCPs since her early teens and had the hysterectomy for AUB. Endo seen in the lower pelvis and biopsy confirmed this. She states [facility] done due to ?preserve sexual function but she isn't exactly sure why. She came off OCPs after the surgery and starting having cyclic pain in the right and left lower quadrant (alternating) that she attributes to her cycles.   No VB or spotting.    Review of Systems: Pertinent items noted in HPI and remainder of comprehensive ROS otherwise negative.    Patient Active Problem List   Diagnosis Date Noted   Endometriosis 11/28/2021   Overweight (BMI 25.0-29.9) 07/11/2021   Skin change 09/20/2020   Migraine 03/31/2020   Acne vulgaris 03/31/2020   Femoroacetabular impingement 12/03/2017   ADHD (attention deficit hyperactivity disorder), combined type 07/23/2017   Anxiety, generalized 07/23/2017   Vasovagal syncope 06/09/2014   Diarrhea 10/20/2013   Non-seasonal allergic rhinitis due to pollen 10/23/2012   Hyperlipidemia 10/23/2012   GERD (gastroesophageal reflux disease) 07/03/2012    Past Medical History:  Past Medical History:  Diagnosis Date   Anemia    YEARS AGO   Anxiety    ASCUS of cervix with negative high risk HPV 06/27/2016   Formatting of this note might be different from the original. Negative HPV 2015 and 2016 Normal Pap 2016 S/p hysterectomy 0865   Complication of anesthesia    GERD (gastroesophageal reflux  disease)    PONV (postoperative nausea and vomiting)     Past Surgical History:  Past Surgical History:  Procedure Laterality Date   CHOLECYSTECTOMY  2014   DILATATION & CURETTAGE/HYSTEROSCOPY WITH MYOSURE N/A 09/06/2018   Procedure: DILATATION & CURETTAGE/HYSTEROSCOPY WITH MYOSURE-resection of polyps,submucosal polyps;  Surgeon: Erica Nearing, MD;  Location: [facility];  Service: Gynecology;  Laterality: N/A;   HIP ARTHROSCOPY Right 12/03/2017   Procedure: ARTHROSCOPY HIP;  Surgeon: Erica Fabry, MD;  Location: [facility];  Service: Orthopedics;  Laterality: Right;   LAPAROSCOPIC SUPRACERVICAL HYSTERECTOMY N/A 05/09/2019   Procedure: LAPAROSCOPIC SUPRACERVICAL HYSTERECTOMY, Bilateral Salpingectomy;  Surgeon: Wells, Erica Her, MD;  Location: [facility] OR[facility];  Service: Gynecology;  Laterality: N/A;    Past Obstetrical History:  OB History  Gravida Para Term Preterm AB Living  0 0 0 0 0 0  SAB IAB Ectopic Multiple Live Births  0 0 0 0 0   Past Gynecological History: As per HPI. History of Pap Smear(s): Yes.   Last pap 2019, which was cyto and hpv wnl   Social History:  Social History   Socioeconomic History   Marital status: Married    Spouse name: Erica Wells   Number of children: 0   Years of education: Bachelors degree   Highest education level: Not on file  Occupational History   Not on file  Tobacco Use   Smoking status: Never   Smokeless tobacco: Never  Vaping Use   Vaping Use: Never used  Substance and Sexual Activity   Alcohol use: Yes    Alcohol/week: 3.0 standard drinks  Types: 3 Cans of beer per week    Comment: 4 times a week, 1-2 beers   Drug use: No   Sexual activity: Yes    Birth control/protection: Surgical    Comment: one partner  Other Topics Concern   Not on file  Social History Narrative   03/31/20   From: [place], came for college   Living: with husband, Erica Wells    Work: Psychologist, 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      Family: good relationship with family -  most of her family is in [place]      Enjoys: hiking, pets (25 chickens - gives away eggs, 3 ducks)      Exercise: hiking 4 times a week 3-4 mile hike   Diet: weight watchers diet - using the app       Safety   Seat belts: Yes    Guns: Yes  and secure   Safe in relationships: Yes    Social Determinants of Radio broadcast assistant Strain: Not on file  Food Insecurity: Not on file  Transportation Needs: Not on file  Physical Activity: Not on file  Stress: Not on file  Social Connections: Not on file  Intimate Partner Violence: Not on file    Family History:  Family History  Problem Relation Age of Onset   Arthritis Mother    Colon polyps Mother    Parkinson's disease Mother    Depression Father    Diabetes Father    Heart disease Father    Non-Hodgkin's lymphoma Father    Alcohol abuse Paternal Grandmother    Heart disease Paternal Grandmother    Heart disease Maternal Grandmother    Colon cancer Maternal Grandfather 56   Diabetes Paternal Grandfather    Breast cancer Maternal Aunt 55    Medications Erica Wells had no medications administered during this visit. Current Outpatient Medications  Medication Sig Dispense Refill   amitriptyline (ELAVIL) 10 MG tablet Take 3 tablets (30 mg total) by mouth at bedtime. 270 tablet 1   amphetamine-dextroamphetamine (ADDERALL XR) 20 MG 24 hr capsule Take 1 capsule (20 mg total) by mouth every morning. 90 capsule 0   clonazePAM (KLONOPIN) 1 MG tablet Take by mouth.     famotidine (PEPCID) 40 MG tablet Take 1 tablet (40 mg total) by mouth daily. 90 tablet 3   lansoprazole (PREVACID) 15 MG capsule TAKE 1 CAPSULE BY MOUTH AT  BEDTIME 90 capsule 3   rizatriptan (MAXALT) 10 MG tablet Take 10 mg by mouth as needed for migraine. May repeat in 2 hours if needed     sertraline (ZOLOFT) 100 MG tablet TAKE 1 TABLET BY MOUTH AT  BEDTIME 90 tablet 3   topiramate (TOPAMAX) 50 MG tablet TAKE 1 TABLET BY MOUTH  TWICE DAILY 180 tablet 3    No current facility-administered medications for this visit.    Allergies Penicillins   Physical Exam:  BP 120/81   Pulse (!) 101   Ht '5\' 8"'$  (1.727 m)   Wt 200 lb (90.7 kg)   LMP 04/16/2019   BMI 30.41 kg/m  Body mass index is 30.41 kg/m.  General appearance: Well nourished, well developed female in no acute distress.   Laboratory: none  Radiology: none  Assessment: pt stable  Plan:  1. Endometriosis determined by laparoscopy I told her that with endo that being on hormone suppression, if your ovaries are left, is recommended until menopause, which she is okay with. I told her I recommend being  on something that would suppress her cycles and treat/suppress any endo so Slynd preferred over Camila/micronor since she has been on OCPs in the past and with possible side effects with depo provera. She is amenable to this and will let us know if Slynd is not covered  2. Pelvic pain  RTC 3 months  Aletha Halim, Brooke Bonito MD Attending Center for [facility])

## 2022-03-22 ENCOUNTER — Other Ambulatory Visit: Payer: Self-pay | Admitting: *Deleted

## 2022-03-22 ENCOUNTER — Encounter: Payer: Self-pay | Admitting: Obstetrics and Gynecology

## 2022-03-22 MED ORDER — SLYND 4 MG PO TABS: 1.0000 | ORAL_TABLET | Freq: Every day | ORAL | 3 refills | Status: DC

## 2022-03-22 NOTE — Progress Notes (Signed)
Resending to speciality pharmacy

## 2022-03-27 ENCOUNTER — Ambulatory Visit: Payer: Managed Care, Other (non HMO) | Admitting: Family Medicine

## 2022-03-27 ENCOUNTER — Encounter: Payer: Self-pay | Admitting: *Deleted

## 2022-03-27 VITALS — BP 100/70 | HR 91 | Temp 98.0°F | Ht 68.25 in | Wt 196.0 lb

## 2022-03-27 DIAGNOSIS — F902 Attention-deficit hyperactivity disorder, combined type: Secondary | ICD-10-CM

## 2022-03-27 DIAGNOSIS — Z0000 Encounter for general adult medical examination without abnormal findings: Secondary | ICD-10-CM | POA: Diagnosis not present

## 2022-03-27 DIAGNOSIS — R197 Diarrhea, unspecified: Secondary | ICD-10-CM

## 2022-03-27 DIAGNOSIS — S46012D Strain of muscle(s) and tendon(s) of the rotator cuff of left shoulder, subsequent encounter: Secondary | ICD-10-CM

## 2022-03-27 DIAGNOSIS — Z9889 Other specified postprocedural states: Secondary | ICD-10-CM

## 2022-03-27 DIAGNOSIS — S46012A Strain of muscle(s) and tendon(s) of the rotator cuff of left shoulder, initial encounter: Secondary | ICD-10-CM | POA: Insufficient documentation

## 2022-03-27 MED ORDER — AMPHETAMINE-DEXTROAMPHET ER 20 MG PO CP24: 20.0000 mg | ORAL_CAPSULE | ORAL | 0 refills | Status: DC

## 2022-03-27 MED ORDER — COLESTIPOL HCL 1 G PO TABS: 1.0000 g | ORAL_TABLET | Freq: Two times a day (BID) | ORAL | 1 refills | Status: DC

## 2022-03-27 NOTE — Progress Notes (Signed)
Annual Exam   Chief Complaint:  Chief Complaint  Patient presents with   Annual Exam    No concerns    History of Present Illness:  [name] is a 41 y.o. G0P0000 who LMP was Patient's last menstrual period was 04/16/2019., presents today for her annual examination.    ADHD - still doing well - ran out for a month due to supply issues   Endometriosis - finally got drospirenone  - will continue through menopause  Nutrition/Lifestyle Diet: gaining some weight - stopped weight watches - hard to keep up Exercise: shoulder injury - which has discouraged her - b/c she usually is walking her dogs She does get adequate calcium and Vitamin D in her diet.  Left shoulder pain - saw ortho - hoping PT will help - had cortisone shot  Social History   Tobacco Use  Smoking Status Never  Smokeless Tobacco Never   Social History   Substance and Sexual Activity  Alcohol Use Yes   Alcohol/week: 3.0 standard drinks of alcohol   Types: 3 Cans of beer per week   Comment: 4 times a week, 1-2 beers   Social History   Substance and Sexual Activity  Drug Use No     Safety The patient wears seatbelts: yes.     The patient feels safe at home and in their relationships: yes.  General Health Dentist in the last year: Yes Eye doctor: yes  Menstrual No periods  GYN She is single partner, contraception - status post hysterectomy.     Cervical Cancer Screening (Age 55-65) 04/2018 - normal  Family History of Breast Cancer: aunts Family History of Ovarian Cancer: no    Weight Wt Readings from Last 3 Encounters:  03/27/22 196 lb (88.9 kg)  03/21/22 200 lb (90.7 kg)  11/28/21 198 lb 3 oz (89.9 kg)   Patient has high BMI  BMI Readings from Last 1 Encounters:  03/27/22 29.58 kg/m     Chronic disease screening Blood pressure monitoring:  BP Readings from Last 3 Encounters:  03/27/22 100/70  03/21/22 120/81  11/28/21 110/80     Lipid Monitoring:  Indication for screening: age >91, obesity, diabetes, family hx, CV risk factors.  Lipid screening: Yes  Lab Results  Component Value Date   CHOL 227 (A) 04/28/2021   HDL 63 04/28/2021   LDLCALC 140 04/28/2021   TRIG 135 04/28/2021     Diabetes Screening: age >28, overweight, family hx, PCOS, hx of gestational diabetes, at risk ethnicity, elevated blood pressure >135/80.  Diabetes Screening screening: Yes  Lab Results  Component Value Date   HGBA1C 5.1 04/28/2021      Past Medical History:  Diagnosis Date   Anemia    YEARS AGO   Anxiety    ASCUS of cervix with negative high risk HPV 06/27/2016   Formatting of this note might be different from the original. Negative HPV 2015 and 2016 Normal Pap 2016 S/p hysterectomy 1610   Complication of anesthesia    GERD (gastroesophageal reflux disease)    PONV (postoperative nausea and vomiting)     Past Surgical History:  Procedure Laterality Date   CHOLECYSTECTOMY  2014   DILATATION & CURETTAGE/HYSTEROSCOPY WITH MYOSURE N/A 09/06/2018   Procedure: DILATATION & CURETTAGE/HYSTEROSCOPY WITH MYOSURE-resection of polyps,submucosal polyps;  Surgeon: Boykin Nearing, MD;  Location: [facility];  Service: Gynecology;  Laterality: N/A;   HIP ARTHROSCOPY Right 12/03/2017   Procedure: ARTHROSCOPY HIP;  Surgeon: Leim Fabry, MD;  Location:  [facility];  Service: Orthopedics;  Laterality: Right;   LAPAROSCOPIC SUPRACERVICAL HYSTERECTOMY N/A 05/09/2019   Procedure: LAPAROSCOPIC SUPRACERVICAL HYSTERECTOMY, Bilateral Salpingectomy;  Surgeon: Schermerhorn, Gwen Her, MD;  Location: [facility];  Service: Gynecology;  Laterality: N/A;    Prior to Admission medications   Medication Sig Start Date End Date Taking? Authorizing Provider  amitriptyline (ELAVIL) 10 MG tablet Take 3 tablets (30 mg total) by mouth at bedtime. 11/28/21  Yes Lesleigh Noe, MD  amphetamine-dextroamphetamine (ADDERALL XR) 20 MG 24 hr capsule Take 1 capsule (20 mg total) by mouth  every morning. 01/26/22  Yes Lesleigh Noe, MD  clonazePAM [name]) 1 MG tablet Take by mouth. 12/08/18  Yes [provider]  Drospirenone (SLYND) 4 MG TABS Take 1 tablet by mouth daily. 03/22/22  Yes Aletha Halim, MD  famotidine (PEPCID) 40 MG tablet Take 1 tablet (40 mg total) by mouth daily. 11/28/21  Yes Lesleigh Noe, MD  lansoprazole (PREVACID) 15 MG capsule TAKE 1 CAPSULE BY MOUTH AT  BEDTIME 12/27/21  Yes Lesleigh Noe, MD  rizatriptan (MAXALT) 10 MG tablet Take 10 mg by mouth as needed for migraine. May repeat in 2 hours if needed   Yes [provider]  sertraline (ZOLOFT) 100 MG tablet TAKE 1 TABLET BY MOUTH AT  BEDTIME 10/21/21  Yes Lesleigh Noe, MD  topiramate (TOPAMAX) 50 MG tablet TAKE 1 TABLET BY MOUTH  TWICE DAILY 07/22/21  Yes Lesleigh Noe, MD    Allergies  Allergen Reactions   Penicillins Anaphylaxis    Has patient had a PCN reaction causing immediate rash, facial/tongue/throat swelling, SOB or lightheadedness with hypotension: Yes Has patient had a PCN reaction causing severe rash involving mucus membranes or skin necrosis: No Has patient had a PCN reaction that required hospitalization: Yes Has patient had a PCN reaction occurring within the last 10 years: No If all of the above answers are "NO", then may proceed with Cephalosporin use.     Gynecologic History: Patient's last menstrual period was 04/16/2019.  Obstetric History: G0P0000  Social History   Socioeconomic History   Marital status: Married    Spouse name: Marcello Moores   Number of children: 0   Years of education: Bachelors degree   Highest education level: Not on file  Occupational History   Not on file  Tobacco Use   Smoking status: Never   Smokeless tobacco: Never  Vaping Use   Vaping Use: Never used  Substance and Sexual Activity   Alcohol use: Yes    Alcohol/week: 3.0 standard drinks of alcohol    Types: 3 Cans of beer per week    Comment: 4 times a week, 1-2 beers    Drug use: No   Sexual activity: Yes    Birth control/protection: Surgical    Comment: one partner  Other Topics Concern   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      Family: good relationship with family - most of her family is in [place]      Enjoys: hiking, pets (25 chickens - gives away eggs, 3 ducks)      Exercise: hiking 4 times a week 3-4 mile hike   Diet: weight watchers diet - using the app       Safety   Seat belts: Yes    Guns: Yes  and secure  Safe in relationships: Yes    Social Determinants of Radio broadcast assistant Strain: Not on file  Food Insecurity: Not on file  Transportation Needs: Not on file  Physical Activity: Not on file  Stress: Not on file  Social Connections: Not on file  Intimate Partner Violence: Not on file    Family History  Problem Relation Age of Onset   Arthritis Mother    Colon polyps Mother    Parkinson's disease Mother    Depression Father    Diabetes Father    Heart disease Father    Non-Hodgkin's lymphoma Father    Alcohol abuse Paternal Grandmother    Heart disease Paternal Grandmother    Heart disease Maternal Grandmother    Colon cancer Maternal Grandfather 84   Diabetes Paternal Grandfather    Breast cancer Maternal Aunt 55    Review of Systems  Constitutional:  Negative for chills and fever.  HENT:  Negative for congestion and sore throat.   Eyes:  Negative for blurred vision and double vision.  Respiratory:  Negative for shortness of breath.   Cardiovascular:  Negative for chest pain.  Gastrointestinal:  Negative for heartburn, nausea and vomiting.  Genitourinary: Negative.   Musculoskeletal:  Positive for joint pain. Negative for myalgias.  Skin:  Negative for rash.  Neurological:  Negative for dizziness and headaches.  Endo/Heme/Allergies:  Does not bruise/bleed easily.   Psychiatric/Behavioral:  Negative for depression. The patient is not nervous/anxious.      Physical Exam BP 100/70   Pulse 91   Temp 98 F (36.7 C) (Temporal)   Ht 5' 8.25" (1.734 m)   Wt 196 lb (88.9 kg)   LMP 04/16/2019   SpO2 98%   BMI 29.58 kg/m    BP Readings from Last 3 Encounters:  03/27/22 100/70  03/21/22 120/81  11/28/21 110/80    Wt Readings from Last 3 Encounters:  03/27/22 196 lb (88.9 kg)  03/21/22 200 lb (90.7 kg)  11/28/21 198 lb 3 oz (89.9 kg)     Physical Exam Constitutional:      General: She is not in acute distress.    Appearance: She is well-developed. She is not diaphoretic.  HENT:     Head: Normocephalic and atraumatic.     Right Ear: External ear normal.     Left Ear: External ear normal.     Nose: Nose normal.  Eyes:     General: No scleral icterus.    Extraocular Movements: Extraocular movements intact.     Conjunctiva/sclera: Conjunctivae normal.  Cardiovascular:     Rate and Rhythm: Normal rate and regular rhythm.     Heart sounds: No murmur heard. Pulmonary:     Effort: Pulmonary effort is normal. No respiratory distress.     Breath sounds: Normal breath sounds. No wheezing.  Abdominal:     General: Bowel sounds are normal. There is no distension.     Palpations: Abdomen is soft. There is no mass.     Tenderness: There is no abdominal tenderness. There is no guarding or rebound.  Musculoskeletal:        General: Normal range of motion.     Cervical back: Neck supple.  Lymphadenopathy:     Cervical: No cervical adenopathy.  Skin:    General: Skin is warm and dry.     Capillary Refill: Capillary refill takes less than 2 seconds.  Neurological:     Mental Status: She is alert and oriented to person, place, and  time.     Deep Tendon Reflexes: Reflexes normal.  Psychiatric:        Mood and Affect: Mood normal.        Behavior: Behavior normal.       Results:    03/27/2022    8:49 AM 06/28/2020    8:37 AM  Depression  screen PHQ 2/9  Decreased Interest 0 0  Down, Depressed, Hopeless 0 1  PHQ - 2 Score 0 1  Altered sleeping  1  Tired, decreased energy  1  Change in appetite  0  Feeling bad or failure about yourself   0  Trouble concentrating  0  Moving slowly or fidgety/restless  0  Suicidal thoughts  0  PHQ-9 Score  3      Assessment: 41 y.o. G0P0000 female here for routine annual examination.  Plan: Problem List Items Addressed This Visit       Other   ADHD (attention deficit hyperactivity disorder), combined type   Relevant Medications   amphetamine-dextroamphetamine (ADDERALL XR) 20 MG 24 hr capsule   amphetamine-dextroamphetamine (ADDERALL XR) 20 MG 24 hr capsule (Start on 04/26/2022)   amphetamine-dextroamphetamine (ADDERALL XR) 20 MG 24 hr capsule (Start on 05/26/2022)   Other Visit Diagnoses     Annual physical exam    -  Primary   Traumatic incomplete tear of left rotator cuff, subsequent encounter       Relevant Orders   Ambulatory referral to Physical Therapy   Diarrhea following gastrointestinal surgery       Relevant Medications   colestipol (COLESTID) 1 g tablet        Screening: -- Blood pressure screen normal -- cholesterol screening:  pt will get with work -- Weight screening: overweight: continue to monitor -- Diabetes Screening:  pt to get with work -- Nutrition: encouraged healthy diet   Psych -- Depression screening (PHQ-9):  [place] Visit from 06/28/2020 in [place] at [facility]  PHQ-9 Total Score 3        Safety -- tobacco screening: not using -- alcohol screening:  low-risk usage. -- no evidence of domestic violence or intimate partner violence.  Cancer Screening -- pap smear not collected per ASCCP guidelines -- family history of breast cancer screening: done. not at high risk.   Immunizations Immunization History  Administered Date(s) Administered   Influenza,inj,Quad PF,6+ Mos 07/23/2017, 10/28/2018,  08/04/2019, 06/28/2020   Influenza-Unspecified 10/28/2018   PFIZER(Purple Top)SARS-COV-2 Vaccination 11/06/2019, 11/27/2019, 08/23/2020   PPD Test 02/04/2014   Tdap 04/14/2019    -- flu vaccine up to date -- TDAP q10 years up to date ---- Covid-19 Vaccine up to date  Encouraged regular vision and dental screening. Encouraged healthy exercise and diet.   Lesleigh Noe

## 2022-04-12 ENCOUNTER — Ambulatory Visit: Payer: Managed Care, Other (non HMO) | Attending: Family Medicine

## 2022-04-12 DIAGNOSIS — S46012D Strain of muscle(s) and tendon(s) of the rotator cuff of left shoulder, subsequent encounter: Secondary | ICD-10-CM | POA: Insufficient documentation

## 2022-04-12 DIAGNOSIS — M25512 Pain in left shoulder: Secondary | ICD-10-CM | POA: Insufficient documentation

## 2022-04-12 DIAGNOSIS — M6281 Muscle weakness (generalized): Secondary | ICD-10-CM | POA: Insufficient documentation

## 2022-04-12 NOTE — Therapy (Signed)
OUTPATIENT PHYSICAL THERAPY SHOULDER EVALUATION   Patient Name: Erica Wells MRN: [id number] DOB:01-Jul-1981, 41 y.o., female Today's Date: 04/12/2022   PT End of Session - 04/12/22 1014     Visit Number 1    Number of Visits 17    Date for PT Re-Evaluation 06/07/22    Authorization Type [employer]    PT Start Time 0915    PT Stop Time 1000    PT Time Calculation (min) 45 min    Activity Tolerance Patient limited by pain    Behavior During Therapy [facility] for tasks assessed/performed             Past Medical History:  Diagnosis Date   Anemia    YEARS AGO   Anxiety    ASCUS of cervix with negative high risk HPV 06/27/2016   Formatting of this note might be different from the original. Negative HPV 2015 and 2016 Normal Pap 2016 S/p hysterectomy 3875   Complication of anesthesia    GERD (gastroesophageal reflux disease)    PONV (postoperative nausea and vomiting)    Past Surgical History:  Procedure Laterality Date   CHOLECYSTECTOMY  2014   DILATATION & CURETTAGE/HYSTEROSCOPY WITH MYOSURE N/A 09/06/2018   Procedure: DILATATION & CURETTAGE/HYSTEROSCOPY WITH MYOSURE-resection of polyps,submucosal polyps;  Surgeon: Boykin Nearing, MD;  Location: [facility] OR[facility];  Service: Gynecology;  Laterality: N/A;   HIP ARTHROSCOPY Right 12/03/2017   Procedure: ARTHROSCOPY HIP;  Surgeon: Leim Fabry, MD;  Location: [facility];  Service: Orthopedics;  Laterality: Right;   LAPAROSCOPIC SUPRACERVICAL HYSTERECTOMY N/A 05/09/2019   Procedure: LAPAROSCOPIC SUPRACERVICAL HYSTERECTOMY, Bilateral Salpingectomy;  Surgeon: Schermerhorn, Gwen Her, MD;  Location: [facility];  Service: Gynecology;  Laterality: N/A;   Patient Active Problem List   Diagnosis Date Noted   Traumatic incomplete tear of left rotator cuff 03/27/2022   Endometriosis 11/28/2021   Overweight (BMI 25.0-29.9) 07/11/2021   Skin change 09/20/2020   Migraine 03/31/2020   Acne vulgaris 03/31/2020   Femoroacetabular impingement 12/03/2017    ADHD (attention deficit hyperactivity disorder), combined type 07/23/2017   Anxiety, generalized 07/23/2017   Vasovagal syncope 06/09/2014   Diarrhea following gastrointestinal surgery 10/20/2013   Non-seasonal allergic rhinitis due to pollen 10/23/2012   Hyperlipidemia 10/23/2012   GERD (gastroesophageal reflux disease) 07/03/2012    PCP: Lesleigh Noe, MD  REFERRING PROVIDER: Lesleigh Noe, MD  REFERRING DIAG: [phone] (ICD-10-CM) - Traumatic incomplete tear of left rotator cuff, subsequent encounter  THERAPY DIAG:  Left shoulder pain, unspecified chronicity  Muscle weakness (generalized)  Rationale for Evaluation and Treatment Rehabilitation  ONSET DATE: 5 month history  SUBJECTIVE:  SUBJECTIVE STATEMENT: Pt presents to PT with reports of roughly five month history of gradually increasing L shoulder pain and discomfort. She denies trauma or MOI to L shoulder, also denies N/T but does promote pain referral into L bicep. Activities that require both UE like gardening increases her pain, as well as prolonged sitting with a rounded shouldered posture and hiking. Also has difficulty with overhead motions and feels her L UE ROM is much more limited than R.   PERTINENT HISTORY: None  PAIN:  Are you having pain?  Yes: NPRS scale: 3/10 (6/10) Pain location: L shoulder Pain description: tight, clicking Aggravating factors: prolonged sitting, hiking Relieving factors: medication, rest  PRECAUTIONS: None  WEIGHT BEARING RESTRICTIONS: No  FALLS:  Has patient fallen in last 6 months? No  LIVING ENVIRONMENT: Lives with: lives with their family Lives in: House/apartment Stairs: No barriers Has following equipment at home: None  OCCUPATION: Librarian, academic at [employer]: Independent and  Independent with basic ADLs  PATIENT GOALS: decrease shoulder pain, improve ROM, get back to hiking more comfortably  OBJECTIVE:   DIAGNOSTIC FINDINGS:  See imaging in PMH  PATIENT SURVEYS:  FOTO: 56% function; 71% predicted  COGNITION:  Overall cognitive status: Within functional limits for tasks assessed     SENSATION: WFL  POSTURE: Fwd head, rounded shoulders  UPPER EXTREMITY ROM:   Active ROM Right eval Left eval  Shoulder flexion WNL 135  Shoulder extension    Shoulder abduction WNL 130  Shoulder adduction    Shoulder internal rotation WNL WNL  Shoulder external rotation WNL WNL  Elbow flexion    Elbow extension    Wrist flexion    Wrist extension    Wrist ulnar deviation    Wrist radial deviation    Wrist pronation    Wrist supination    (Blank rows = not tested)  UPPER EXTREMITY MMT:  MMT Right eval Left eval  Shoulder flexion WNL 4/5  Shoulder extension    Shoulder abduction WNL 4/5  Shoulder adduction    Shoulder internal rotation WNL 3+/5  Shoulder external rotation WNL 3+/5 p!  Middle trapezius    Lower trapezius    Elbow flexion    Elbow extension    Wrist flexion    Wrist extension    Wrist ulnar deviation    Wrist radial deviation    Wrist pronation    Wrist supination    Grip strength (lbs)    (Blank rows = not tested)  SHOULDER SPECIAL TESTS:  N/A  PALPATION:  TTP to L infraspinatus   TODAY'S TREATMENT:  [facility] Adult PT Treatment:                                                DATE: 04/12/2022 Therapeutic Exercise: Row x 10 BTB L shoulder IR/ER x 10 - 5" hold  PATIENT EDUCATION: Education details: eval findings, FOTO, HEP, POC Person educated: Patient Education method: Explanation, Demonstration, and Handouts Education comprehension: verbalized understanding and returned demonstration   HOME EXERCISE PROGRAM: Access Code: [email or web address] URL: [email or web address] Date: 04/12/2022 Prepared by: Octavio Manns  Exercises - Standing Shoulder Row with Anchored Resistance  - 1-2 x daily - 7 x weekly - 3 sets - 10 reps - blue theraband hold - Standing Isometric Shoulder Internal Rotation with Towel Roll at Doorway  - 1-2 x daily - 7  x weekly - 2-3 sets - 10 reps - 5 sec hold - Standing Isometric Shoulder External Rotation with Doorway and Towel Roll  - 1-2 x daily - 7 x weekly - 2-3 sets - 10 reps - 5 sec hold  ASSESSMENT:  CLINICAL IMPRESSION: Patient is a 41 y.o. F who was seen today for physical therapy evaluation and treatment for roughly five month history of left shoulder pain. Physical findings are consistent with MD impression as pt demonstrates decreased L shoulder range, painful palpation to L infraspinatus, and decreased RTC muscle strength. Her FOTO score shows she is operating below her PLOF and indicates she would benefit from skilled PT services working on improving strength and decreasing pain. Will progress as able per POC.    OBJECTIVE IMPAIRMENTS decreased endurance, decreased ROM, decreased strength, impaired UE functional use, and pain  ACTIVITY LIMITATIONS carrying, lifting, reach over head, and gardening  PARTICIPATION LIMITATIONS: occupation, yard work, and recreational activities (hiking)  [place] Time since onset of injury/illness/exacerbation are also affecting patient's functional outcome.   REHAB POTENTIAL: Excellent  CLINICAL DECISION MAKING: Stable/uncomplicated  EVALUATION COMPLEXITY: Low   GOALS: Goals reviewed with patient? No  SHORT TERM GOALS: Target date: 05/03/2022  Pt will be compliant and knowledgeable with initial HEP for improved comfort and carryover Baseline: initial HEP given  Goal status: INITIAL  2.  Pt will self report let shoulder pain no greater than 4/10 for improved comfort and functional ability Baseline: 6/10 at worst Goal status: INITIAL  LONG TERM GOALS: Target date: 06/07/2022   Pt will self report left shoulder  pain no greater than 1-2/10 for improved comfort and functional ability Baseline: 6/10 at worst  Goal status: INITIAL  2.  Pt will improve FOTO function score to no less than 71% as proxy for functional improvement Baseline: 56% function Goal status: INITIAL  3.  Pt will improve L shoulder flex/abd to no less than 160 deg AROM for improved  Baseline: see chart Goal status: INITIAL  4.  Pt will improve L shoulder IR/ER MMT to no less than 4/5 for improved dynamic stability and decrease pain Baseline: see chart Goal status: INITIAL  PLAN: PT FREQUENCY: 2x/week  PT DURATION: 8 weeks  PLANNED INTERVENTIONS: Therapeutic exercises, Therapeutic activity, Neuromuscular re-education, Balance training, Gait training, Patient/Family education, Joint mobilization, Dry Needling, Electrical stimulation, Cryotherapy, Moist heat, Manual therapy, and Re-evaluation  PLAN FOR NEXT SESSION: assess HEP response, progress RTC and periscapular strength, TPDN?   Ward Chatters, PT 04/12/2022, 10:27 AM

## 2022-04-20 NOTE — Therapy (Signed)
OUTPATIENT PHYSICAL THERAPY TREATMENT NOTE   Patient Name: Erica Wells MRN: [id number] DOB:1981/01/12, 41 y.o., female Today's Date: 04/24/2022  PCP: Lesleigh Noe, MD  REFERRING PROVIDER: Lesleigh Noe, MD  END OF SESSION:   PT End of Session - 04/24/22 1105     Visit Number 2    Number of Visits 17    Date for PT Re-Evaluation 06/07/22    Authorization Type [employer]    PT Start Time 1103    PT Stop Time 1145    PT Time Calculation (min) 42 min    Activity Tolerance Patient tolerated treatment well    Behavior During Therapy WFL for tasks assessed/performed             Past Medical History:  Diagnosis Date   Anemia    YEARS AGO   Anxiety    ASCUS of cervix with negative high risk HPV 06/27/2016   Formatting of this note might be different from the original. Negative HPV 2015 and 2016 Normal Pap 2016 S/p hysterectomy 6712   Complication of anesthesia    GERD (gastroesophageal reflux disease)    PONV (postoperative nausea and vomiting)    Past Surgical History:  Procedure Laterality Date   CHOLECYSTECTOMY  2014   DILATATION & CURETTAGE/HYSTEROSCOPY WITH MYOSURE N/A 09/06/2018   Procedure: DILATATION & CURETTAGE/HYSTEROSCOPY WITH MYOSURE-resection of polyps,submucosal polyps;  Surgeon: Boykin Nearing, MD;  Location: [facility];  Service: Gynecology;  Laterality: N/A;   HIP ARTHROSCOPY Right 12/03/2017   Procedure: ARTHROSCOPY HIP;  Surgeon: Leim Fabry, MD;  Location: [facility];  Service: Orthopedics;  Laterality: Right;   LAPAROSCOPIC SUPRACERVICAL HYSTERECTOMY N/A 05/09/2019   Procedure: LAPAROSCOPIC SUPRACERVICAL HYSTERECTOMY, Bilateral Salpingectomy;  Surgeon: Schermerhorn, Gwen Her, MD;  Location: [facility];  Service: Gynecology;  Laterality: N/A;   Patient Active Problem List   Diagnosis Date Noted   Traumatic incomplete tear of left rotator cuff 03/27/2022   Endometriosis 11/28/2021   Overweight (BMI 25.0-29.9) 07/11/2021   Skin change 09/20/2020    Migraine 03/31/2020   Acne vulgaris 03/31/2020   Femoroacetabular impingement 12/03/2017   ADHD (attention deficit hyperactivity disorder), combined type 07/23/2017   Anxiety, generalized 07/23/2017   Vasovagal syncope 06/09/2014   Diarrhea following gastrointestinal surgery 10/20/2013   Non-seasonal allergic rhinitis due to pollen 10/23/2012   Hyperlipidemia 10/23/2012   GERD (gastroesophageal reflux disease) 07/03/2012    REFERRING DIAG: W58.099I (ICD-10-CM) - Traumatic incomplete tear of left rotator cuff, subsequent encounter  THERAPY DIAG:  Left shoulder pain, unspecified chronicity  Muscle weakness (generalized)  Rationale for Evaluation and Treatment Rehabilitation  PERTINENT HISTORY: None  PRECAUTIONS: None  SUBJECTIVE: Pt reports 2/10 pain today with manageable pain. She states that she tried gardening yesterday, with no increase in pain noted.   PAIN:  Are you having pain?  Yes: NPRS scale: 2/10 (6/10) Pain location: L shoulder Pain description: tight, clicking Aggravating factors: prolonged sitting, hiking Relieving factors: medication, rest   OBJECTIVE:    DIAGNOSTIC FINDINGS:  See imaging in PMH   PATIENT SURVEYS:  FOTO: 56% function; 71% predicted   COGNITION:           Overall cognitive status: Within functional limits for tasks assessed                                  SENSATION: WFL   POSTURE: Fwd head, rounded shoulders   UPPER EXTREMITY ROM:  Active ROM Right eval Left eval  Shoulder flexion WNL 135  Shoulder extension      Shoulder abduction WNL 130  Shoulder adduction      Shoulder internal rotation WNL WNL  Shoulder external rotation WNL WNL  Elbow flexion      Elbow extension      Wrist flexion      Wrist extension      Wrist ulnar deviation      Wrist radial deviation      Wrist pronation      Wrist supination      (Blank rows = not tested)   UPPER EXTREMITY MMT:   MMT Right eval Left eval  Shoulder flexion WNL  4/5  Shoulder extension      Shoulder abduction WNL 4/5  Shoulder adduction      Shoulder internal rotation WNL 3+/5  Shoulder external rotation WNL 3+/5 p!  Middle trapezius      Lower trapezius      Elbow flexion      Elbow extension      Wrist flexion      Wrist extension      Wrist ulnar deviation      Wrist radial deviation      Wrist pronation      Wrist supination      Grip strength (lbs)      (Blank rows = not tested)   SHOULDER SPECIAL TESTS:            N/A   PALPATION:  TTP to L infraspinatus             TODAY'S TREATMENT:  [facility] Adult PT Treatment:                                                DATE: 04/24/2022 Therapeutic Exercise: UBE 2.5'/2.5' fwd and backward for warm up while taking subjective Row 2 x 10 BTB Extension 2 x 10 BTB L shoulder IR 2 x 10 RTB  L shoulder ER isometrics into wall 5 sec hold 1 x 10 (YTB caused increased pain)  Horizontal abduction 1 x 10 GTB Lower trap ball taps with red weighted ball 1 min x 2  [facility] Adult PT Treatment:                                                DATE: 04/12/2022 Therapeutic Exercise: Row x 10 BTB L shoulder IR/ER x 10 - 5" hold   PATIENT EDUCATION: Education details: eval findings, FOTO, HEP, POC Person educated: Patient Education method: Explanation, Demonstration, and Handouts Education comprehension: verbalized understanding and returned demonstration     HOME EXERCISE PROGRAM: Access Code: [email or web address] URL: [email or web address] Date: 04/12/2022 Prepared by: Octavio Manns   Exercises - Standing Shoulder Row with Anchored Resistance  - 1-2 x daily - 7 x weekly - 3 sets - 10 reps - blue theraband hold - Standing Isometric Shoulder Internal Rotation with Towel Roll at Doorway  - 1-2 x daily - 7 x weekly - 2-3 sets - 10 reps - 5 sec hold - Standing Isometric Shoulder External Rotation with Doorway and Towel Roll  - 1-2 x daily - 7 x weekly - 2-3 sets - 10 reps -  5 sec hold   ASSESSMENT:    CLINICAL IMPRESSION: Patient presents to first follow up appt, with improved tolerable pain. Pt able to go through all prescribed exercises with slight modifications made throughout. Pt most challenged with ER and ball taps today. Educated pt on thoracic mobility and chin tucks due to limitations noted. Pt reports increased fatigue at end of session today, with no increase in pain. Pt will continue to benefit from skilled PT to address continued deficits.     OBJECTIVE IMPAIRMENTS decreased endurance, decreased ROM, decreased strength, impaired UE functional use, and pain   ACTIVITY LIMITATIONS carrying, lifting, reach over head, and gardening   PARTICIPATION LIMITATIONS: occupation, yard work, and recreational activities (hiking)   [place] Time since onset of injury/illness/exacerbation are also affecting patient's functional outcome.    REHAB POTENTIAL: Excellent   CLINICAL DECISION MAKING: Stable/uncomplicated   EVALUATION COMPLEXITY: Low     GOALS: Goals reviewed with patient? No   SHORT TERM GOALS: Target date: 05/03/2022   Pt will be compliant and knowledgeable with initial HEP for improved comfort and carryover Baseline: initial HEP given  Goal status: INITIAL   2.  Pt will self report let shoulder pain no greater than 4/10 for improved comfort and functional ability Baseline: 6/10 at worst Goal status: INITIAL   LONG TERM GOALS: Target date: 06/07/2022    Pt will self report left shoulder pain no greater than 1-2/10 for improved comfort and functional ability Baseline: 6/10 at worst  Goal status: INITIAL   2.  Pt will improve FOTO function score to no less than 71% as proxy for functional improvement Baseline: 56% function Goal status: INITIAL   3.  Pt will improve L shoulder flex/abd to no less than 160 deg AROM for improved  Baseline: see chart Goal status: INITIAL   4.  Pt will improve L shoulder IR/ER MMT to no less than 4/5 for improved dynamic  stability and decrease pain Baseline: see chart Goal status: INITIAL   PLAN: PT FREQUENCY: 2x/week   PT DURATION: 8 weeks   PLANNED INTERVENTIONS: Therapeutic exercises, Therapeutic activity, Neuromuscular re-education, Balance training, Gait training, Patient/Family education, Joint mobilization, Dry Needling, Electrical stimulation, Cryotherapy, Moist heat, Manual therapy, and Re-evaluation   PLAN FOR NEXT SESSION: assess HEP response, progress RTC and periscapular strength, TPDN?    Lynden Ang, PT 04/24/2022, 11:51 AM

## 2022-04-24 ENCOUNTER — Ambulatory Visit: Payer: Managed Care, Other (non HMO) | Attending: Family Medicine | Admitting: Physical Therapy

## 2022-04-24 ENCOUNTER — Encounter: Payer: Self-pay | Admitting: Physical Therapy

## 2022-04-24 DIAGNOSIS — M25512 Pain in left shoulder: Secondary | ICD-10-CM | POA: Insufficient documentation

## 2022-04-24 DIAGNOSIS — M6281 Muscle weakness (generalized): Secondary | ICD-10-CM | POA: Insufficient documentation

## 2022-04-24 LAB — LIPID PANEL
Cholesterol: 207 — AB (ref 0–200)
HDL: 49 (ref 35–70)
LDL Cholesterol: 141
Triglycerides: 94 (ref 40–160)

## 2022-04-24 LAB — BASIC METABOLIC PANEL
Creatinine: 0.8 (ref 0.5–1.1)
Glucose: 85

## 2022-04-24 LAB — HEMOGLOBIN A1C: Hemoglobin A1C: 5.3

## 2022-04-24 LAB — COMPREHENSIVE METABOLIC PANEL: eGFR: 104

## 2022-04-25 NOTE — Therapy (Signed)
OUTPATIENT PHYSICAL THERAPY TREATMENT NOTE   Patient Name: Erica Wells MRN: [id number] DOB:[birth year], 41 y.o., female Today's Date: 04/26/2022  PCP: Lesleigh Noe, MD  REFERRING PROVIDER: Lesleigh Noe, MD  END OF SESSION:   PT End of Session - 04/26/22 0913     Visit Number 3    Number of Visits 17    Date for PT Re-Evaluation 06/07/22    Authorization Type [employer]    PT Start Time 0915    PT Stop Time 0955    PT Time Calculation (min) 40 min    Activity Tolerance Patient tolerated treatment well    Behavior During Therapy [facility] for tasks assessed/performed              Past Medical History:  Diagnosis Date   Anemia    YEARS AGO   Anxiety    ASCUS of cervix with negative high risk HPV 06/27/2016   Formatting of this note might be different from the original. Negative HPV 2015 and 2016 Normal Pap 2016 S/p hysterectomy 0160   Complication of anesthesia    GERD (gastroesophageal reflux disease)    PONV (postoperative nausea and vomiting)    Past Surgical History:  Procedure Laterality Date   CHOLECYSTECTOMY  2014   DILATATION & CURETTAGE/HYSTEROSCOPY WITH MYOSURE N/A 09/06/2018   Procedure: DILATATION & CURETTAGE/HYSTEROSCOPY WITH MYOSURE-resection of polyps,submucosal polyps;  Surgeon: Boykin Nearing, MD;  Location: [facility];  Service: Gynecology;  Laterality: N/A;   HIP ARTHROSCOPY Right 12/03/2017   Procedure: ARTHROSCOPY HIP;  Surgeon: Leim Fabry, MD;  Location: [facility];  Service: Orthopedics;  Laterality: Right;   LAPAROSCOPIC SUPRACERVICAL HYSTERECTOMY N/A 05/09/2019   Procedure: LAPAROSCOPIC SUPRACERVICAL HYSTERECTOMY, Bilateral Salpingectomy;  Surgeon: Schermerhorn, Gwen Her, MD;  Location: [facility];  Service: Gynecology;  Laterality: N/A;   Patient Active Problem List   Diagnosis Date Noted   Traumatic incomplete tear of left rotator cuff 03/27/2022   Endometriosis 11/28/2021   Overweight (BMI 25.0-29.9) 07/11/2021   Skin change 09/20/2020    Migraine 03/31/2020   Acne vulgaris 03/31/2020   Femoroacetabular impingement 12/03/2017   ADHD (attention deficit hyperactivity disorder), combined type 07/23/2017   Anxiety, generalized 07/23/2017   Vasovagal syncope 06/09/2014   Diarrhea following gastrointestinal surgery 10/20/2013   Non-seasonal allergic rhinitis due to pollen 10/23/2012   Hyperlipidemia 10/23/2012   GERD (gastroesophageal reflux disease) 07/03/2012    REFERRING DIAG: F09.323F (ICD-10-CM) - Traumatic incomplete tear of left rotator cuff, subsequent encounter  THERAPY DIAG:  Left shoulder pain, unspecified chronicity  Muscle weakness (generalized)  Rationale for Evaluation and Treatment Rehabilitation  PERTINENT HISTORY: None  PRECAUTIONS: None  SUBJECTIVE: Patient reports 4/10 pain today and states she been sore since the last session. She reports that she has done some gardening this week with no issues in her shoulder.   PAIN:  Are you having pain?  Yes: NPRS scale: 4/10 (6/10) Pain location: L shoulder Pain description: tight, clicking Aggravating factors: prolonged sitting, hiking Relieving factors: medication, rest   OBJECTIVE:    DIAGNOSTIC FINDINGS:  See imaging in PMH   PATIENT SURVEYS:  FOTO: 56% function; 71% predicted   COGNITION:           Overall cognitive status: Within functional limits for tasks assessed                                  SENSATION: WFL   POSTURE: Fwd head,  rounded shoulders   UPPER EXTREMITY ROM:    Active ROM Right eval Left eval  Shoulder flexion WNL 135  Shoulder extension      Shoulder abduction WNL 130  Shoulder adduction      Shoulder internal rotation WNL WNL  Shoulder external rotation WNL WNL  Elbow flexion      Elbow extension      Wrist flexion      Wrist extension      Wrist ulnar deviation      Wrist radial deviation      Wrist pronation      Wrist supination      (Blank rows = not tested)   UPPER EXTREMITY MMT:   MMT  Right eval Left eval  Shoulder flexion WNL 4/5  Shoulder extension      Shoulder abduction WNL 4/5  Shoulder adduction      Shoulder internal rotation WNL 3+/5  Shoulder external rotation WNL 3+/5 p!  Middle trapezius      Lower trapezius      Elbow flexion      Elbow extension      Wrist flexion      Wrist extension      Wrist ulnar deviation      Wrist radial deviation      Wrist pronation      Wrist supination      Grip strength (lbs)      (Blank rows = not tested)   SHOULDER SPECIAL TESTS:            N/A   PALPATION:  TTP to L infraspinatus             TODAY'S TREATMENT:  [facility] Adult PT Treatment:                                                DATE: 04/26/2022 Therapeutic Exercise: UBE level 2 x 4 mins 2 mins fwd and backward for warm up while taking subjective Row 2 x 10 BTB Extension 2 x 10 BTB L shoulder IR 2 x 10 RTB  L shoulder ER YTB x10  Horizontal abduction 2 x 10 GTB back against wall Diagonals GTB x10 BIL back against wall  Chin tucks into ball on wall  5" hold x10 Placing 1# weight into bottom/middle shelf of cabinet 2x10 Lt forward flexion and abduction Upper trap stretch 2x30" BIL Levator scap stretch 2x30" BIL   [facility] Adult PT Treatment:                                                DATE: 04/24/2022 Therapeutic Exercise: UBE 2.5'/2.5' fwd and backward for warm up while taking subjective Row 2 x 10 BTB Extension 2 x 10 BTB L shoulder IR 2 x 10 RTB  L shoulder ER isometrics into wall 5 sec hold 1 x 10 (YTB caused increased pain)  Horizontal abduction 1 x 10 GTB Lower trap ball taps with red weighted ball 1 min x 2  [facility] Adult PT Treatment:  DATE: 04/12/2022 Therapeutic Exercise: Row x 10 BTB L shoulder IR/ER x 10 - 5" hold   PATIENT EDUCATION: Education details: eval findings, FOTO, HEP, POC Person educated: Patient Education method: Explanation, Demonstration, and Handouts Education  comprehension: verbalized understanding and returned demonstration     HOME EXERCISE PROGRAM: Access Code: [email or web address] URL: [email or web address] Date: 04/12/2022 Prepared by: Octavio Manns   Exercises - Standing Shoulder Row with Anchored Resistance  - 1-2 x daily - 7 x weekly - 3 sets - 10 reps - blue theraband hold - Standing Isometric Shoulder Internal Rotation with Towel Roll at Doorway  - 1-2 x daily - 7 x weekly - 2-3 sets - 10 reps - 5 sec hold - Standing Isometric Shoulder External Rotation with Doorway and Towel Roll  - 1-2 x daily - 7 x weekly - 2-3 sets - 10 reps - 5 sec hold   ASSESSMENT:   CLINICAL IMPRESSION:  Patient presents to PT with increased pain and soreness this session that she attributes to previous session this week. Session today focused on RTC and periscapular strengthening to improve mobility and decrease pain. She had improved tolerance for exercises this session with more exercises performed and able to complete ER using banded resistance with minimal increase in pain. Patient continues to benefit from skilled PT services and should be progressed as able to improve functional independence.    OBJECTIVE IMPAIRMENTS decreased endurance, decreased ROM, decreased strength, impaired UE functional use, and pain   ACTIVITY LIMITATIONS carrying, lifting, reach over head, and gardening   PARTICIPATION LIMITATIONS: occupation, yard work, and recreational activities (hiking)   [place] Time since onset of injury/illness/exacerbation are also affecting patient's functional outcome.    REHAB POTENTIAL: Excellent   CLINICAL DECISION MAKING: Stable/uncomplicated   EVALUATION COMPLEXITY: Low     GOALS: Goals reviewed with patient? No   SHORT TERM GOALS: Target date: 05/03/2022   Pt will be compliant and knowledgeable with initial HEP for improved comfort and carryover Baseline: initial HEP given  Goal status: INITIAL   2.  Pt will self  report let shoulder pain no greater than 4/10 for improved comfort and functional ability Baseline: 6/10 at worst Goal status: INITIAL   LONG TERM GOALS: Target date: 06/07/2022    Pt will self report left shoulder pain no greater than 1-2/10 for improved comfort and functional ability Baseline: 6/10 at worst  Goal status: INITIAL   2.  Pt will improve FOTO function score to no less than 71% as proxy for functional improvement Baseline: 56% function Goal status: INITIAL   3.  Pt will improve L shoulder flex/abd to no less than 160 deg AROM for improved  Baseline: see chart Goal status: INITIAL   4.  Pt will improve L shoulder IR/ER MMT to no less than 4/5 for improved dynamic stability and decrease pain Baseline: see chart Goal status: INITIAL   PLAN: PT FREQUENCY: 2x/week   PT DURATION: 8 weeks   PLANNED INTERVENTIONS: Therapeutic exercises, Therapeutic activity, Neuromuscular re-education, Balance training, Gait training, Patient/Family education, Joint mobilization, Dry Needling, Electrical stimulation, Cryotherapy, Moist heat, Manual therapy, and Re-evaluation   PLAN FOR NEXT SESSION: assess HEP response, progress RTC and periscapular strength, TPDN?    Evelene Croon, PTA 04/26/2022, 9:56 AM

## 2022-04-26 ENCOUNTER — Ambulatory Visit: Payer: Managed Care, Other (non HMO)

## 2022-04-26 DIAGNOSIS — M25512 Pain in left shoulder: Secondary | ICD-10-CM | POA: Diagnosis not present

## 2022-04-26 DIAGNOSIS — M6281 Muscle weakness (generalized): Secondary | ICD-10-CM

## 2022-05-01 ENCOUNTER — Ambulatory Visit: Payer: Managed Care, Other (non HMO)

## 2022-05-01 DIAGNOSIS — M25512 Pain in left shoulder: Secondary | ICD-10-CM | POA: Diagnosis not present

## 2022-05-01 DIAGNOSIS — M6281 Muscle weakness (generalized): Secondary | ICD-10-CM

## 2022-05-01 NOTE — Therapy (Signed)
OUTPATIENT PHYSICAL THERAPY TREATMENT NOTE   Patient Name: Erica Wells MRN: [id number] DOB:May 30, 1981, 41 y.o., female Today's Date: 05/01/2022  PCP: Lesleigh Noe, MD  REFERRING PROVIDER: Lesleigh Noe, MD  END OF SESSION:   PT End of Session - 05/01/22 0914     Visit Number 4    Number of Visits 17    Date for PT Re-Evaluation 06/07/22    Authorization Type [employer]    PT Start Time 0915    PT Stop Time 0957    PT Time Calculation (min) 42 min    Activity Tolerance Patient tolerated treatment well    Behavior During Therapy [facility] for tasks assessed/performed               Past Medical History:  Diagnosis Date   Anemia    YEARS AGO   Anxiety    ASCUS of cervix with negative high risk HPV 06/27/2016   Formatting of this note might be different from the original. Negative HPV 2015 and 2016 Normal Pap 2016 S/p hysterectomy 7824   Complication of anesthesia    GERD (gastroesophageal reflux disease)    PONV (postoperative nausea and vomiting)    Past Surgical History:  Procedure Laterality Date   CHOLECYSTECTOMY  2014   DILATATION & CURETTAGE/HYSTEROSCOPY WITH MYOSURE N/A 09/06/2018   Procedure: DILATATION & CURETTAGE/HYSTEROSCOPY WITH MYOSURE-resection of polyps,submucosal polyps;  Surgeon: Boykin Nearing, MD;  Location: [facility];  Service: Gynecology;  Laterality: N/A;   HIP ARTHROSCOPY Right 12/03/2017   Procedure: ARTHROSCOPY HIP;  Surgeon: Leim Fabry, MD;  Location: [facility];  Service: Orthopedics;  Laterality: Right;   LAPAROSCOPIC SUPRACERVICAL HYSTERECTOMY N/A 05/09/2019   Procedure: LAPAROSCOPIC SUPRACERVICAL HYSTERECTOMY, Bilateral Salpingectomy;  Surgeon: Schermerhorn, Gwen Her, MD;  Location: [facility];  Service: Gynecology;  Laterality: N/A;   Patient Active Problem List   Diagnosis Date Noted   Traumatic incomplete tear of left rotator cuff 03/27/2022   Endometriosis 11/28/2021   Overweight (BMI 25.0-29.9) 07/11/2021   Skin change  09/20/2020   Migraine 03/31/2020   Acne vulgaris 03/31/2020   Femoroacetabular impingement 12/03/2017   ADHD (attention deficit hyperactivity disorder), combined type 07/23/2017   Anxiety, generalized 07/23/2017   Vasovagal syncope 06/09/2014   Diarrhea following gastrointestinal surgery 10/20/2013   Non-seasonal allergic rhinitis due to pollen 10/23/2012   Hyperlipidemia 10/23/2012   GERD (gastroesophageal reflux disease) 07/03/2012    REFERRING DIAG: M35.361W (ICD-10-CM) - Traumatic incomplete tear of left rotator cuff, subsequent encounter  THERAPY DIAG:  Left shoulder pain, unspecified chronicity  Muscle weakness (generalized)  Rationale for Evaluation and Treatment Rehabilitation  PERTINENT HISTORY: None  PRECAUTIONS: None  SUBJECTIVE:  Pt presents   PAIN:  Are you having pain?  Yes: NPRS scale: 4/10 (6/10) Pain location: L shoulder Pain description: tight, clicking Aggravating factors: prolonged sitting, hiking Relieving factors: medication, rest   OBJECTIVE:    PATIENT SURVEYS:  FOTO: 56% function; 71% predicted   UPPER EXTREMITY ROM:    Active ROM Right eval Left eval  Shoulder flexion WNL 135  Shoulder extension      Shoulder abduction WNL 130  Shoulder adduction      Shoulder internal rotation WNL WNL  Shoulder external rotation WNL WNL  Elbow flexion      Elbow extension      Wrist flexion      Wrist extension      Wrist ulnar deviation      Wrist radial deviation      Wrist pronation  Wrist supination      (Blank rows = not tested)   UPPER EXTREMITY MMT:   MMT Right eval Left eval  Shoulder flexion WNL 4/5  Shoulder extension      Shoulder abduction WNL 4/5  Shoulder adduction      Shoulder internal rotation WNL 3+/5  Shoulder external rotation WNL 3+/5 p!  Middle trapezius      Lower trapezius      Elbow flexion      Elbow extension      Wrist flexion      Wrist extension      Wrist ulnar deviation      Wrist radial  deviation      Wrist pronation      Wrist supination      Grip strength (lbs)      (Blank rows = not tested)   SHOULDER SPECIAL TESTS:            N/A   PALPATION:  TTP to L infraspinatus             TODAY'S TREATMENT:  [facility] Adult PT Treatment:                                                DATE: 05/01/2022 Therapeutic Exercise: UBE level 2 x 4 mins 2 mins fwd and backward for warm up while taking subjective Row 2x12 17# Shoulder extension 2x10 17#  L shoulder IR 2x10 7# L shoulder ER 2x10 3# YTB serratus roll with soft foam x 10 Seated bilateral ER with scapular retraction 2x10 GTB Horizontal abduction 2x10 GTB Diagonals GTB 2x10 BIL Supine serratus punch 2x10 3#  [facility] Adult PT Treatment:                                                DATE: 04/26/2022 Therapeutic Exercise: UBE level 2 x 4 mins 2 mins fwd and backward for warm up while taking subjective Row 2 x 10 BTB Extension 2 x 10 BTB L shoulder IR 2 x 10 RTB  L shoulder ER YTB x10  Horizontal abduction 2 x 10 GTB back against wall Diagonals GTB x10 BIL back against wall  Chin tucks into ball on wall  5" hold x10 Placing 1# weight into bottom/middle shelf of cabinet 2x10 Lt forward flexion and abduction Upper trap stretch 2x30" BIL Levator scap stretch 2x30" BIL   [facility] Adult PT Treatment:                                                DATE: 04/24/2022 Therapeutic Exercise: UBE 2.5'/2.5' fwd and backward for warm up while taking subjective Row 2 x 10 BTB Extension 2 x 10 BTB L shoulder IR 2 x 10 RTB  L shoulder ER isometrics into wall 5 sec hold 1 x 10 (YTB caused increased pain)  Horizontal abduction 1 x 10 GTB Lower trap ball taps with red weighted ball 1 min x 2  [facility] Adult PT Treatment:  DATE: 04/12/2022 Therapeutic Exercise: Row x 10 BTB L shoulder IR/ER x 10 - 5" hold   PATIENT EDUCATION: Education details: eval findings, FOTO, HEP, POC Person educated:  Patient Education method: Explanation, Demonstration, and Handouts Education comprehension: verbalized understanding and returned demonstration     HOME EXERCISE PROGRAM: Access Code: [email or web address] URL: [email or web address] Date: 04/12/2022 Prepared by: Octavio Manns   Exercises - Standing Shoulder Row with Anchored Resistance  - 1-2 x daily - 7 x weekly - 3 sets - 10 reps - blue theraband hold - Standing Isometric Shoulder Internal Rotation with Towel Roll at Doorway  - 1-2 x daily - 7 x weekly - 2-3 sets - 10 reps - 5 sec hold - Standing Isometric Shoulder External Rotation with Doorway and Towel Roll  - 1-2 x daily - 7 x weekly - 2-3 sets - 10 reps - 5 sec hold   ASSESSMENT:   CLINICAL IMPRESSION: Pt was again able to complete all prescribed exercises with no adverse effect or increase in pain. Therapy focused on continuing to progress periscapular and RTC strength in order to decrease pain and stability shoulder. Will continue to progress as able per POC.    OBJECTIVE IMPAIRMENTS decreased endurance, decreased ROM, decreased strength, impaired UE functional use, and pain   ACTIVITY LIMITATIONS carrying, lifting, reach over head, and gardening   PARTICIPATION LIMITATIONS: occupation, yard work, and recreational activities (hiking)   [place] Time since onset of injury/illness/exacerbation are also affecting patient's functional outcome.      GOALS: Goals reviewed with patient? No   SHORT TERM GOALS: Target date: 05/03/2022   Pt will be compliant and knowledgeable with initial HEP for improved comfort and carryover Baseline: initial HEP given  Goal status: INITIAL   2.  Pt will self report let shoulder pain no greater than 4/10 for improved comfort and functional ability Baseline: 6/10 at worst Goal status: INITIAL   LONG TERM GOALS: Target date: 06/07/2022    Pt will self report left shoulder pain no greater than 1-2/10 for improved comfort and  functional ability Baseline: 6/10 at worst  Goal status: INITIAL   2.  Pt will improve FOTO function score to no less than 71% as proxy for functional improvement Baseline: 56% function Goal status: INITIAL   3.  Pt will improve L shoulder flex/abd to no less than 160 deg AROM for improved  Baseline: see chart Goal status: INITIAL   4.  Pt will improve L shoulder IR/ER MMT to no less than 4/5 for improved dynamic stability and decrease pain Baseline: see chart Goal status: INITIAL   PLAN: PT FREQUENCY: 2x/week   PT DURATION: 8 weeks   PLANNED INTERVENTIONS: Therapeutic exercises, Therapeutic activity, Neuromuscular re-education, Balance training, Gait training, Patient/Family education, Joint mobilization, Dry Needling, Electrical stimulation, Cryotherapy, Moist heat, Manual therapy, and Re-evaluation   PLAN FOR NEXT SESSION: assess HEP response, progress RTC and periscapular strength, TPDN?    Ward Chatters, PT 05/01/2022, 9:59 AM

## 2022-05-03 ENCOUNTER — Ambulatory Visit: Payer: Managed Care, Other (non HMO)

## 2022-05-03 DIAGNOSIS — M25512 Pain in left shoulder: Secondary | ICD-10-CM | POA: Diagnosis not present

## 2022-05-03 DIAGNOSIS — M6281 Muscle weakness (generalized): Secondary | ICD-10-CM

## 2022-05-03 NOTE — Therapy (Signed)
OUTPATIENT PHYSICAL THERAPY TREATMENT NOTE   Patient Name: Erica Wells MRN: [id number] DOB:April 08, 1981, 41 y.o., female Today's Date: 05/03/2022  PCP: Lesleigh Noe, MD  REFERRING PROVIDER: Lesleigh Noe, MD  END OF SESSION:   PT End of Session - 05/03/22 0920     Visit Number 5    Number of Visits 17    Date for PT Re-Evaluation 06/07/22    Authorization Type [employer]    PT Start Time 0920    PT Stop Time 1000    PT Time Calculation (min) 40 min    Activity Tolerance Patient tolerated treatment well    Behavior During Therapy [facility] for tasks assessed/performed                Past Medical History:  Diagnosis Date   Anemia    YEARS AGO   Anxiety    ASCUS of cervix with negative high risk HPV 06/27/2016   Formatting of this note might be different from the original. Negative HPV 2015 and 2016 Normal Pap 2016 S/p hysterectomy 9381   Complication of anesthesia    GERD (gastroesophageal reflux disease)    PONV (postoperative nausea and vomiting)    Past Surgical History:  Procedure Laterality Date   CHOLECYSTECTOMY  2014   DILATATION & CURETTAGE/HYSTEROSCOPY WITH MYOSURE N/A 09/06/2018   Procedure: DILATATION & CURETTAGE/HYSTEROSCOPY WITH MYOSURE-resection of polyps,submucosal polyps;  Surgeon: Boykin Nearing, MD;  Location: [facility];  Service: Gynecology;  Laterality: N/A;   HIP ARTHROSCOPY Right 12/03/2017   Procedure: ARTHROSCOPY HIP;  Surgeon: Leim Fabry, MD;  Location: [facility];  Service: Orthopedics;  Laterality: Right;   LAPAROSCOPIC SUPRACERVICAL HYSTERECTOMY N/A 05/09/2019   Procedure: LAPAROSCOPIC SUPRACERVICAL HYSTERECTOMY, Bilateral Salpingectomy;  Surgeon: Schermerhorn, Gwen Her, MD;  Location: [facility];  Service: Gynecology;  Laterality: N/A;   Patient Active Problem List   Diagnosis Date Noted   Traumatic incomplete tear of left rotator cuff 03/27/2022   Endometriosis 11/28/2021   Overweight (BMI 25.0-29.9) 07/11/2021   Skin change  09/20/2020   Migraine 03/31/2020   Acne vulgaris 03/31/2020   Femoroacetabular impingement 12/03/2017   ADHD (attention deficit hyperactivity disorder), combined type 07/23/2017   Anxiety, generalized 07/23/2017   Vasovagal syncope 06/09/2014   Diarrhea following gastrointestinal surgery 10/20/2013   Non-seasonal allergic rhinitis due to pollen 10/23/2012   Hyperlipidemia 10/23/2012   GERD (gastroesophageal reflux disease) 07/03/2012    REFERRING DIAG: O17.510C (ICD-10-CM) - Traumatic incomplete tear of left rotator cuff, subsequent encounter  THERAPY DIAG:  Left shoulder pain, unspecified chronicity  Muscle weakness (generalized)  Rationale for Evaluation and Treatment Rehabilitation  PERTINENT HISTORY: None  PRECAUTIONS: None  SUBJECTIVE:  Pt presents to PT with continued reports of L shoulder pain and muscle soreness. Has been compliant with HEP with no adverse effect. Pt is ready to begin PT at this time.   PAIN:  Are you having pain?  Yes: NPRS scale: 4/10 (6/10) Pain location: L shoulder Pain description: tight, clicking Aggravating factors: prolonged sitting, hiking Relieving factors: medication, rest   OBJECTIVE:    PATIENT SURVEYS:  FOTO: 56% function; 71% predicted   UPPER EXTREMITY ROM:    Active ROM Right eval Left eval  Shoulder flexion WNL 135  Shoulder extension      Shoulder abduction WNL 130  Shoulder adduction      Shoulder internal rotation WNL WNL  Shoulder external rotation WNL WNL  Elbow flexion      Elbow extension      Wrist  flexion      Wrist extension      Wrist ulnar deviation      Wrist radial deviation      Wrist pronation      Wrist supination      (Blank rows = not tested)   UPPER EXTREMITY MMT:   MMT Right eval Left eval  Shoulder flexion WNL 4/5  Shoulder extension      Shoulder abduction WNL 4/5  Shoulder adduction      Shoulder internal rotation WNL 3+/5  Shoulder external rotation WNL 3+/5 p!  Middle  trapezius      Lower trapezius      Elbow flexion      Elbow extension      Wrist flexion      Wrist extension      Wrist ulnar deviation      Wrist radial deviation      Wrist pronation      Wrist supination      Grip strength (lbs)      (Blank rows = not tested)   SHOULDER SPECIAL TESTS:            N/A   PALPATION:  TTP to L infraspinatus             TODAY'S TREATMENT:  [facility] Adult PT Treatment:                                                DATE: 05/03/2022 Therapeutic Exercise: UBE level 2 x 4 mins 2 mins fwd and backward for warm up while taking subjective Row 2x12 17# Shoulder extension 2x10 17#  L shoulder IR 2x10 7# L shoulder ER 2x10 3# YTB serratus roll with soft foam x 10 Seated bilateral ER with scapular retraction 2x10 GTB S/L ER 2x10 2# Horizontal abduction 2x10 GTB Diagonals GTB 2x10 BIL Supine serratus punch 2x10 3#  [facility] Adult PT Treatment:                                                DATE: 05/01/2022 Therapeutic Exercise: UBE level 2 x 4 mins 2 mins fwd and backward for warm up while taking subjective Row 2x12 17# Shoulder extension 2x10 17#  L shoulder IR 2x10 7# L shoulder ER 2x10 3# YTB serratus roll with soft foam x 10 Seated bilateral ER with scapular retraction 2x10 GTB Horizontal abduction 2x10 GTB Diagonals GTB 2x10 BIL Supine serratus punch 2x10 3#  [facility] Adult PT Treatment:                                                DATE: 04/26/2022 Therapeutic Exercise: UBE level 2 x 4 mins 2 mins fwd and backward for warm up while taking subjective Row 2 x 10 BTB Extension 2 x 10 BTB L shoulder IR 2 x 10 RTB  L shoulder ER YTB x10  Horizontal abduction 2 x 10 GTB back against wall Diagonals GTB x10 BIL back against wall  Chin tucks into ball on wall  5" hold x10 Placing 1# weight into bottom/middle shelf of cabinet 2x10  Lt forward flexion and abduction Upper trap stretch 2x30" BIL Levator scap stretch 2x30" BIL   PATIENT  EDUCATION: Education details: eval findings, FOTO, HEP, POC Person educated: Patient Education method: Explanation, Demonstration, and Handouts Education comprehension: verbalized understanding and returned demonstration     HOME EXERCISE PROGRAM: Access Code: [email or web address] URL: [email or web address] Date: 04/12/2022 Prepared by: Octavio Manns   Exercises - Standing Shoulder Row with Anchored Resistance  - 1-2 x daily - 7 x weekly - 3 sets - 10 reps - blue theraband hold - Standing Isometric Shoulder Internal Rotation with Towel Roll at Doorway  - 1-2 x daily - 7 x weekly - 2-3 sets - 10 reps - 5 sec hold - Standing Isometric Shoulder External Rotation with Doorway and Towel Roll  - 1-2 x daily - 7 x weekly - 2-3 sets - 10 reps - 5 sec hold   ASSESSMENT:   CLINICAL IMPRESSION: Pt was able to complete all prescribed exercises with no adverse effect or increase in pain. Therapy continued to focus on improving periscapular strength and RTC stability in order to decrease pain and improve function. Pt continues to benefit from skilled PT services and will continue to be seen and progressed as able.    OBJECTIVE IMPAIRMENTS decreased endurance, decreased ROM, decreased strength, impaired UE functional use, and pain   ACTIVITY LIMITATIONS carrying, lifting, reach over head, and gardening   PARTICIPATION LIMITATIONS: occupation, yard work, and recreational activities (hiking)   [place] Time since onset of injury/illness/exacerbation are also affecting patient's functional outcome.      GOALS: Goals reviewed with patient? No   SHORT TERM GOALS: Target date: 05/03/2022   Pt will be compliant and knowledgeable with initial HEP for improved comfort and carryover Baseline: initial HEP given  Goal status: MET   2.  Pt will self report let shoulder pain no greater than 4/10 for improved comfort and functional ability Baseline: 6/10 at worst Goal status: MET   LONG TERM  GOALS: Target date: 06/07/2022    Pt will self report left shoulder pain no greater than 1-2/10 for improved comfort and functional ability Baseline: 6/10 at worst  Goal status: INITIAL   2.  Pt will improve FOTO function score to no less than 71% as proxy for functional improvement Baseline: 56% function Goal status: INITIAL   3.  Pt will improve L shoulder flex/abd to no less than 160 deg AROM for improved  Baseline: see chart Goal status: INITIAL   4.  Pt will improve L shoulder IR/ER MMT to no less than 4/5 for improved dynamic stability and decrease pain Baseline: see chart Goal status: INITIAL   PLAN: PT FREQUENCY: 2x/week   PT DURATION: 8 weeks   PLANNED INTERVENTIONS: Therapeutic exercises, Therapeutic activity, Neuromuscular re-education, Balance training, Gait training, Patient/Family education, Joint mobilization, Dry Needling, Electrical stimulation, Cryotherapy, Moist heat, Manual therapy, and Re-evaluation   PLAN FOR NEXT SESSION: assess HEP response, progress RTC and periscapular strength, TPDN?    Ward Chatters, PT 05/03/2022, 10:04 AM

## 2022-05-04 NOTE — Therapy (Signed)
OUTPATIENT PHYSICAL THERAPY TREATMENT NOTE   Patient Name: Erica Wells MRN: [id number] DOB:August 09, 1981, 41 y.o., female Today's Date: 05/04/2022  PCP: Lesleigh Noe, MD  REFERRING PROVIDER: Lesleigh Noe, MD  END OF SESSION:        Past Medical History:  Diagnosis Date   Anemia    YEARS AGO   Anxiety    ASCUS of cervix with negative high risk HPV 06/27/2016   Formatting of this note might be different from the original. Negative HPV 2015 and 2016 Normal Pap 2016 S/p hysterectomy 5631   Complication of anesthesia    GERD (gastroesophageal reflux disease)    PONV (postoperative nausea and vomiting)    Past Surgical History:  Procedure Laterality Date   CHOLECYSTECTOMY  2014   DILATATION & CURETTAGE/HYSTEROSCOPY WITH MYOSURE N/A 09/06/2018   Procedure: DILATATION & CURETTAGE/HYSTEROSCOPY WITH MYOSURE-resection of polyps,submucosal polyps;  Surgeon: Boykin Nearing, MD;  Location: [facility];  Service: Gynecology;  Laterality: N/A;   HIP ARTHROSCOPY Right 12/03/2017   Procedure: ARTHROSCOPY HIP;  Surgeon: Leim Fabry, MD;  Location: [facility];  Service: Orthopedics;  Laterality: Right;   LAPAROSCOPIC SUPRACERVICAL HYSTERECTOMY N/A 05/09/2019   Procedure: LAPAROSCOPIC SUPRACERVICAL HYSTERECTOMY, Bilateral Salpingectomy;  Surgeon: Schermerhorn, Gwen Her, MD;  Location: [facility];  Service: Gynecology;  Laterality: N/A;   Patient Active Problem List   Diagnosis Date Noted   Traumatic incomplete tear of left rotator cuff 03/27/2022   Endometriosis 11/28/2021   Overweight (BMI 25.0-29.9) 07/11/2021   Skin change 09/20/2020   Migraine 03/31/2020   Acne vulgaris 03/31/2020   Femoroacetabular impingement 12/03/2017   ADHD (attention deficit hyperactivity disorder), combined type 07/23/2017   Anxiety, generalized 07/23/2017   Vasovagal syncope 06/09/2014   Diarrhea following gastrointestinal surgery 10/20/2013   Non-seasonal allergic rhinitis due to pollen 10/23/2012    Hyperlipidemia 10/23/2012   GERD (gastroesophageal reflux disease) 07/03/2012    REFERRING DIAG: S97.026V (ICD-10-CM) - Traumatic incomplete tear of left rotator cuff, subsequent encounter  THERAPY DIAG:  No diagnosis found.  Rationale for Evaluation and Treatment Rehabilitation  PERTINENT HISTORY: None  PRECAUTIONS: None  SUBJECTIVE:  Pt states that she is having minimal L shoulder pain today, despite gardening all weekend. Has been compliant with HEP with no adverse effect. Pt is ready to begin PT at this time.   PAIN:  Are you having pain?  Yes: NPRS scale: 2/10 (6/10) Pain location: L shoulder Pain description: tight, clicking Aggravating factors: prolonged sitting, hiking Relieving factors: medication, rest   OBJECTIVE:    PATIENT SURVEYS:  FOTO: 56% function; 71% predicted   UPPER EXTREMITY ROM:    Active ROM Right eval Left eval Left 05/08/2022  Shoulder flexion WNL 135 180  Shoulder extension       Shoulder abduction WNL 130 180  Shoulder adduction       Shoulder internal rotation WNL WNL   Shoulder external rotation WNL WNL   Elbow flexion       Elbow extension       Wrist flexion       Wrist extension       Wrist ulnar deviation       Wrist radial deviation       Wrist pronation       Wrist supination       (Blank rows = not tested)   UPPER EXTREMITY MMT:   MMT Right eval Left eval  Shoulder flexion WNL 4/5  Shoulder extension      Shoulder abduction WNL  4/5  Shoulder adduction      Shoulder internal rotation WNL 3+/5  Shoulder external rotation WNL 3+/5 p!  Middle trapezius      Lower trapezius      Elbow flexion      Elbow extension      Wrist flexion      Wrist extension      Wrist ulnar deviation      Wrist radial deviation      Wrist pronation      Wrist supination      Grip strength (lbs)      (Blank rows = not tested)   SHOULDER SPECIAL TESTS:            N/A   PALPATION:  TTP to L infraspinatus             TODAY'S  TREATMENT:  [facility] Adult PT Treatment:                                                DATE: 05/08/2022 Therapeutic Exercise: UBE level 2 x 4 mins 2 mins fwd and backward for warm up while taking subjective Row 2x12 17# - cues due to overuse of UT. Shoulder extension 2x10 17#  L shoulder IR 2x10 7# L shoulder ER 2x10 3# YTB serratus roll with soft foam x 10 Seated bilateral ER with scapular retraction 2x10 GTB Weighted ball taps on wall x1 min Wall clocks 2x5 bilat  Diagonals GTB 2x10 BIL Serratus push ups standing with YTB x20 [facility] Adult PT Treatment:                                                DATE: 05/03/2022 Therapeutic Exercise: UBE level 2 x 4 mins 2 mins fwd and backward for warm up while taking subjective Row 2x12 17# Shoulder extension 2x10 17#  L shoulder IR 2x10 7# L shoulder ER 2x10 3# YTB serratus roll with soft foam x 10 Seated bilateral ER with scapular retraction 2x10 GTB S/L ER 2x10 2# Horizontal abduction 2x10 GTB Diagonals GTB 2x10 BIL Supine serratus punch 2x10 3#  [facility] Adult PT Treatment:                                                DATE: 05/01/2022 Therapeutic Exercise: UBE level 2 x 4 mins 2 mins fwd and backward for warm up while taking subjective Row 2x12 17# Shoulder extension 2x10 17#  L shoulder IR 2x10 7# L shoulder ER 2x10 3# YTB serratus roll with soft foam x 10 Seated bilateral ER with scapular retraction 2x10 GTB Horizontal abduction 2x10 GTB Diagonals GTB 2x10 BIL Supine serratus punch 2x10 3#  PATIENT EDUCATION: Education details: eval findings, FOTO, HEP, POC Person educated: Patient Education method: Explanation, Demonstration, and Handouts Education comprehension: verbalized understanding and returned demonstration     HOME EXERCISE PROGRAM: Access Code: [email or web address] URL: [email or web address] Date: 04/12/2022 Prepared by: Octavio Manns   Exercises - Standing Shoulder Row with Anchored Resistance  - 1-2 x daily - 7 x  weekly - 3 sets - 10 reps - blue theraband  hold - Standing Isometric Shoulder Internal Rotation with Towel Roll at Doorway  - 1-2 x daily - 7 x weekly - 2-3 sets - 10 reps - 5 sec hold - Standing Isometric Shoulder External Rotation with Doorway and Towel Roll  - 1-2 x daily - 7 x weekly - 2-3 sets - 10 reps - 5 sec hold   ASSESSMENT:   CLINICAL IMPRESSION: Pt was able to complete all prescribed exercises with minor progressions with no adverse effect or increase in pain. Therapy continued to focus on improving periscapular strength and RTC stability in order to decrease pain and improve function.  Pt requires intermittent cues throughout session due to overuse of UT's. Pt continues to benefit from skilled PT services and will continue to be seen and progressed as able.    OBJECTIVE IMPAIRMENTS decreased endurance, decreased ROM, decreased strength, impaired UE functional use, and pain   ACTIVITY LIMITATIONS carrying, lifting, reach over head, and gardening   PARTICIPATION LIMITATIONS: occupation, yard work, and recreational activities (hiking)   [place] Time since onset of injury/illness/exacerbation are also affecting patient's functional outcome.      GOALS: Goals reviewed with patient? No   SHORT TERM GOALS: Target date: 05/03/2022   Pt will be compliant and knowledgeable with initial HEP for improved comfort and carryover Baseline: initial HEP given  Goal status: MET   2.  Pt will self report let shoulder pain no greater than 4/10 for improved comfort and functional ability Baseline: 6/10 at worst Goal status: MET   LONG TERM GOALS: Target date: 06/07/2022    Pt will self report left shoulder pain no greater than 1-2/10 for improved comfort and functional ability Baseline: 6/10 at worst  Goal status: MET 05/08/2022   2.  Pt will improve FOTO function score to no less than 71% as proxy for functional improvement Baseline: 56% function Goal status: INITIAL   3.  Pt  will improve L shoulder flex/abd to no less than 160 deg AROM for improved  Baseline: see chart Goal status: MET 05/08/2022   4.  Pt will improve L shoulder IR/ER MMT to no less than 4/5 for improved dynamic stability and decrease pain Baseline: see chart Goal status: INITIAL   PLAN: PT FREQUENCY: 2x/week   PT DURATION: 8 weeks   PLANNED INTERVENTIONS: Therapeutic exercises, Therapeutic activity, Neuromuscular re-education, Balance training, Gait training, Patient/Family education, Joint mobilization, Dry Needling, Electrical stimulation, Cryotherapy, Moist heat, Manual therapy, and Re-evaluation   PLAN FOR NEXT SESSION: assess HEP response, progress RTC and periscapular strength, TPDN?    Lynden Ang, PT 05/04/2022, 8:47 AM

## 2022-05-06 ENCOUNTER — Encounter: Payer: Self-pay | Admitting: Family Medicine

## 2022-05-08 ENCOUNTER — Encounter: Payer: Self-pay | Admitting: Family Medicine

## 2022-05-08 ENCOUNTER — Encounter: Payer: Self-pay | Admitting: Physical Therapy

## 2022-05-08 ENCOUNTER — Ambulatory Visit: Payer: Managed Care, Other (non HMO) | Admitting: Physical Therapy

## 2022-05-08 DIAGNOSIS — M25512 Pain in left shoulder: Secondary | ICD-10-CM

## 2022-05-08 DIAGNOSIS — M6281 Muscle weakness (generalized): Secondary | ICD-10-CM

## 2022-05-08 NOTE — Therapy (Unsigned)
OUTPATIENT PHYSICAL THERAPY TREATMENT NOTE   Patient Name: Erica Wells MRN: [id number] DOB:[birth year], 41 y.o., female Today's Date: 05/08/2022  PCP: Lesleigh Noe, MD  REFERRING PROVIDER: Lesleigh Noe, MD  END OF SESSION:        Past Medical History:  Diagnosis Date   Anemia    YEARS AGO   Anxiety    ASCUS of cervix with negative high risk HPV 06/27/2016   Formatting of this note might be different from the original. Negative HPV 2015 and 2016 Normal Pap 2016 S/p hysterectomy 1155   Complication of anesthesia    GERD (gastroesophageal reflux disease)    PONV (postoperative nausea and vomiting)    Past Surgical History:  Procedure Laterality Date   CHOLECYSTECTOMY  2014   DILATATION & CURETTAGE/HYSTEROSCOPY WITH MYOSURE N/A 09/06/2018   Procedure: DILATATION & CURETTAGE/HYSTEROSCOPY WITH MYOSURE-resection of polyps,submucosal polyps;  Surgeon: Boykin Nearing, MD;  Location: [facility];  Service: Gynecology;  Laterality: N/A;   HIP ARTHROSCOPY Right 12/03/2017   Procedure: ARTHROSCOPY HIP;  Surgeon: Leim Fabry, MD;  Location: [facility];  Service: Orthopedics;  Laterality: Right;   LAPAROSCOPIC SUPRACERVICAL HYSTERECTOMY N/A 05/09/2019   Procedure: LAPAROSCOPIC SUPRACERVICAL HYSTERECTOMY, Bilateral Salpingectomy;  Surgeon: Schermerhorn, Gwen Her, MD;  Location: [facility];  Service: Gynecology;  Laterality: N/A;   Patient Active Problem List   Diagnosis Date Noted   Traumatic incomplete tear of left rotator cuff 03/27/2022   Endometriosis 11/28/2021   Overweight (BMI 25.0-29.9) 07/11/2021   Skin change 09/20/2020   Migraine 03/31/2020   Acne vulgaris 03/31/2020   Femoroacetabular impingement 12/03/2017   ADHD (attention deficit hyperactivity disorder), combined type 07/23/2017   Anxiety, generalized 07/23/2017   Vasovagal syncope 06/09/2014   Diarrhea following gastrointestinal surgery 10/20/2013   Non-seasonal allergic rhinitis due to pollen 10/23/2012    Hyperlipidemia 10/23/2012   GERD (gastroesophageal reflux disease) 07/03/2012    REFERRING DIAG: M08.022V (ICD-10-CM) - Traumatic incomplete tear of left rotator cuff, subsequent encounter  THERAPY DIAG:  No diagnosis found.  Rationale for Evaluation and Treatment Rehabilitation  PERTINENT HISTORY: None  PRECAUTIONS: None  SUBJECTIVE:  Pt states that she is having minimal L shoulder pain today, despite gardening all weekend. Has been compliant with HEP with no adverse effect. Pt is ready to begin PT at this time.   PAIN:  Are you having pain?  Yes: NPRS scale: 2/10 (6/10) Pain location: L shoulder Pain description: tight, clicking Aggravating factors: prolonged sitting, hiking Relieving factors: medication, rest   OBJECTIVE:    PATIENT SURVEYS:  FOTO: 56% function; 71% predicted   UPPER EXTREMITY ROM:    Active ROM Right eval Left eval Left 05/08/2022  Shoulder flexion WNL 135 180  Shoulder extension       Shoulder abduction WNL 130 180  Shoulder adduction       Shoulder internal rotation WNL WNL   Shoulder external rotation WNL WNL   Elbow flexion       Elbow extension       Wrist flexion       Wrist extension       Wrist ulnar deviation       Wrist radial deviation       Wrist pronation       Wrist supination       (Blank rows = not tested)   UPPER EXTREMITY MMT:   MMT Right eval Left eval  Shoulder flexion WNL 4/5  Shoulder extension      Shoulder abduction WNL  4/5  Shoulder adduction      Shoulder internal rotation WNL 3+/5  Shoulder external rotation WNL 3+/5 p!  Middle trapezius      Lower trapezius      Elbow flexion      Elbow extension      Wrist flexion      Wrist extension      Wrist ulnar deviation      Wrist radial deviation      Wrist pronation      Wrist supination      Grip strength (lbs)      (Blank rows = not tested)   SHOULDER SPECIAL TESTS:            N/A   PALPATION:  TTP to L infraspinatus             TODAY'S  TREATMENT:  [facility] Adult PT Treatment:                                                DATE: 05/08/2022 Therapeutic Exercise: UBE level 2 x 4 mins 2 mins fwd and backward for warm up while taking subjective Row 2x12 17# - cues due to overuse of UT. Shoulder extension 2x10 17#  L shoulder IR 2x10 7# L shoulder ER 2x10 3# YTB serratus roll with soft foam x 10 Seated bilateral ER with scapular retraction 2x10 GTB Weighted ball taps on wall x1 min Wall clocks 2x5 bilat  Diagonals GTB 2x10 BIL Serratus push ups standing with YTB x20 [facility] Adult PT Treatment:                                                DATE: 05/03/2022 Therapeutic Exercise: UBE level 2 x 4 mins 2 mins fwd and backward for warm up while taking subjective Row 2x12 17# Shoulder extension 2x10 17#  L shoulder IR 2x10 7# L shoulder ER 2x10 3# YTB serratus roll with soft foam x 10 Seated bilateral ER with scapular retraction 2x10 GTB S/L ER 2x10 2# Horizontal abduction 2x10 GTB Diagonals GTB 2x10 BIL Supine serratus punch 2x10 3#  [facility] Adult PT Treatment:                                                DATE: 05/01/2022 Therapeutic Exercise: UBE level 2 x 4 mins 2 mins fwd and backward for warm up while taking subjective Row 2x12 17# Shoulder extension 2x10 17#  L shoulder IR 2x10 7# L shoulder ER 2x10 3# YTB serratus roll with soft foam x 10 Seated bilateral ER with scapular retraction 2x10 GTB Horizontal abduction 2x10 GTB Diagonals GTB 2x10 BIL Supine serratus punch 2x10 3#  PATIENT EDUCATION: Education details: eval findings, FOTO, HEP, POC Person educated: Patient Education method: Explanation, Demonstration, and Handouts Education comprehension: verbalized understanding and returned demonstration     HOME EXERCISE PROGRAM: Access Code: [email or web address] URL: [email or web address] Date: 04/12/2022 Prepared by: Octavio Manns   Exercises - Standing Shoulder Row with Anchored Resistance  - 1-2 x daily - 7 x  weekly - 3 sets - 10 reps - blue theraband  hold - Standing Isometric Shoulder Internal Rotation with Towel Roll at Doorway  - 1-2 x daily - 7 x weekly - 2-3 sets - 10 reps - 5 sec hold - Standing Isometric Shoulder External Rotation with Doorway and Towel Roll  - 1-2 x daily - 7 x weekly - 2-3 sets - 10 reps - 5 sec hold   ASSESSMENT:   CLINICAL IMPRESSION: Pt was able to complete all prescribed exercises with minor progressions with no adverse effect or increase in pain. Therapy continued to focus on improving periscapular strength and RTC stability in order to decrease pain and improve function.  Pt requires intermittent cues throughout session due to overuse of UT's. Pt continues to benefit from skilled PT services and will continue to be seen and progressed as able.    OBJECTIVE IMPAIRMENTS decreased endurance, decreased ROM, decreased strength, impaired UE functional use, and pain   ACTIVITY LIMITATIONS carrying, lifting, reach over head, and gardening   PARTICIPATION LIMITATIONS: occupation, yard work, and recreational activities (hiking)   [place] Time since onset of injury/illness/exacerbation are also affecting patient's functional outcome.      GOALS: Goals reviewed with patient? No   SHORT TERM GOALS: Target date: 05/03/2022   Pt will be compliant and knowledgeable with initial HEP for improved comfort and carryover Baseline: initial HEP given  Goal status: MET   2.  Pt will self report let shoulder pain no greater than 4/10 for improved comfort and functional ability Baseline: 6/10 at worst Goal status: MET   LONG TERM GOALS: Target date: 06/07/2022    Pt will self report left shoulder pain no greater than 1-2/10 for improved comfort and functional ability Baseline: 6/10 at worst  Goal status: MET 05/08/2022   2.  Pt will improve FOTO function score to no less than 71% as proxy for functional improvement Baseline: 56% function Goal status: INITIAL   3.  Pt  will improve L shoulder flex/abd to no less than 160 deg AROM for improved  Baseline: see chart Goal status: MET 05/08/2022   4.  Pt will improve L shoulder IR/ER MMT to no less than 4/5 for improved dynamic stability and decrease pain Baseline: see chart Goal status: INITIAL   PLAN: PT FREQUENCY: 2x/week   PT DURATION: 8 weeks   PLANNED INTERVENTIONS: Therapeutic exercises, Therapeutic activity, Neuromuscular re-education, Balance training, Gait training, Patient/Family education, Joint mobilization, Dry Needling, Electrical stimulation, Cryotherapy, Moist heat, Manual therapy, and Re-evaluation   PLAN FOR NEXT SESSION: assess HEP response, progress RTC and periscapular strength, TPDN?  PHYSICAL THERAPY DISCHARGE SUMMARY  Visits from Start of Care: ***  Current functional level related to goals / functional outcomes: ***   Remaining deficits: ***   Education / Equipment: ***   Patient agrees to discharge. Patient goals were {OP Goals:25702::"met"}. Patient is being discharged due to {OP Discharge Reasons:25703::"meeting the stated rehab goals."}   Lynden Ang, PT 05/08/2022, 4:06 PM

## 2022-05-08 NOTE — Telephone Encounter (Signed)
Labs abstracted 

## 2022-05-11 ENCOUNTER — Ambulatory Visit: Payer: Managed Care, Other (non HMO) | Admitting: Physical Therapy

## 2022-05-11 ENCOUNTER — Encounter: Payer: Self-pay | Admitting: Physical Therapy

## 2022-05-11 DIAGNOSIS — M6281 Muscle weakness (generalized): Secondary | ICD-10-CM

## 2022-05-11 DIAGNOSIS — M25512 Pain in left shoulder: Secondary | ICD-10-CM

## 2022-05-28 ENCOUNTER — Other Ambulatory Visit: Payer: Self-pay | Admitting: Family Medicine

## 2022-05-30 NOTE — Telephone Encounter (Signed)
Request for next refill Last filled: Amitriptyline 11/28/21 #270 1 refill, Tomapax 07/22/2021 #180 with 3 refills. LOV 03/27/22 CPE No future appointments

## 2022-06-29 ENCOUNTER — Encounter: Payer: Self-pay | Admitting: Family Medicine

## 2022-07-10 ENCOUNTER — Ambulatory Visit: Payer: Managed Care, Other (non HMO) | Admitting: Family Medicine

## 2022-07-10 VITALS — BP 110/70 | HR 104 | Temp 97.7°F | Wt 202.4 lb

## 2022-07-10 DIAGNOSIS — G43009 Migraine without aura, not intractable, without status migrainosus: Secondary | ICD-10-CM

## 2022-07-10 DIAGNOSIS — F411 Generalized anxiety disorder: Secondary | ICD-10-CM

## 2022-07-10 DIAGNOSIS — Z23 Encounter for immunization: Secondary | ICD-10-CM

## 2022-07-10 DIAGNOSIS — F902 Attention-deficit hyperactivity disorder, combined type: Secondary | ICD-10-CM | POA: Diagnosis not present

## 2022-07-10 MED ORDER — AMPHETAMINE-DEXTROAMPHET ER 20 MG PO CP24: 20.0000 mg | ORAL_CAPSULE | ORAL | 0 refills | Status: DC

## 2022-07-10 MED ORDER — RIZATRIPTAN BENZOATE 10 MG PO TABS: 10.0000 mg | ORAL_TABLET | ORAL | 0 refills | Status: DC | PRN

## 2022-07-10 NOTE — Assessment & Plan Note (Addendum)
Stable. Cont topamax 50 mg and amitriptyline 30 mg. Rare use of maxalt 10 mg

## 2022-07-10 NOTE — Assessment & Plan Note (Signed)
Stable. Doing well on sertraline 100 mg

## 2022-07-10 NOTE — Assessment & Plan Note (Signed)
Stable. Doing well on Adderall XR 20 mg daily.

## 2022-07-10 NOTE — Progress Notes (Signed)
   Subjective:     Erica Wells is a 41 y.o. female presenting for Follow-up (ADHD and TOC )     HPI  Recently recovered from sinus infection  #ADHD - continues to do well on adderall 20 xr - Working third shift still - Sleeping ok  Anxiety - stable on medication  - cannot remember the last time she used clonazepam   Review of Systems   Social History   Tobacco Use  Smoking Status Never  Smokeless Tobacco Never        Objective:    BP Readings from Last 3 Encounters:  07/10/22 110/70  03/27/22 100/70  03/21/22 120/81   Wt Readings from Last 3 Encounters:  07/10/22 202 lb 6 oz (91.8 kg)  03/27/22 196 lb (88.9 kg)  03/21/22 200 lb (90.7 kg)    BP 110/70   Pulse (!) 104   Temp 97.7 F (36.5 C) (Temporal)   Wt 202 lb 6 oz (91.8 kg)   LMP 04/16/2019   SpO2 99%   BMI 30.55 kg/m    Physical Exam Constitutional:      General: She is not in acute distress.    Appearance: She is well-developed. She is not diaphoretic.  HENT:     Right Ear: External ear normal.     Left Ear: External ear normal.     Nose: Nose normal.  Eyes:     Conjunctiva/sclera: Conjunctivae normal.  Cardiovascular:     Rate and Rhythm: Regular rhythm. Tachycardia present.     Heart sounds: No murmur heard. Pulmonary:     Effort: Pulmonary effort is normal. No respiratory distress.     Breath sounds: Normal breath sounds. No wheezing.  Musculoskeletal:     Cervical back: Neck supple.  Skin:    General: Skin is warm and dry.     Capillary Refill: Capillary refill takes less than 2 seconds.  Neurological:     Mental Status: She is alert. Mental status is at baseline.  Psychiatric:        Mood and Affect: Mood normal.        Behavior: Behavior normal.           Assessment & Plan:   Problem List Items Addressed This Visit       Cardiovascular and Mediastinum   Migraine - Primary    Stable. Cont topamax 50 mg and amitriptyline 30 mg. Rare use of maxalt 10 mg       Relevant Medications   rizatriptan (MAXALT) 10 MG tablet     Other   ADHD (attention deficit hyperactivity disorder), combined type    Stable. Doing well on Adderall XR 20 mg daily.       Relevant Medications   amphetamine-dextroamphetamine (ADDERALL XR) 20 MG 24 hr capsule (Start on 08/09/2022)   Anxiety, generalized    Stable. Doing well on sertraline 100 mg       Other Visit Diagnoses     Need for influenza vaccination       Relevant Orders   Flu Vaccine QUAD 18moIM (Fluarix, Fluzone & Alfiuria Quad PF) (Completed)        Return in about 2 months (around 09/09/2022) for TOC with Tabitha .  JLesleigh Noe MD

## 2022-09-11 ENCOUNTER — Telehealth: Payer: Self-pay | Admitting: Family Medicine

## 2022-09-11 ENCOUNTER — Encounter: Payer: Managed Care, Other (non HMO) | Admitting: Family

## 2022-09-11 NOTE — Telephone Encounter (Signed)
Called patient to get her rescheduled and it will not be until Feb 05/2023 and she is out of the medication amphetamine-dextroamphetamine (ADDERALL XR) 20 MG 24 hr capsule and needs it refilled. Call back number [phone].

## 2022-09-15 ENCOUNTER — Other Ambulatory Visit: Payer: Self-pay | Admitting: Family

## 2022-09-15 DIAGNOSIS — F902 Attention-deficit hyperactivity disorder, combined type: Secondary | ICD-10-CM

## 2022-09-15 NOTE — Telephone Encounter (Signed)
Left message to return call to our office.  

## 2022-09-15 NOTE — Telephone Encounter (Signed)
Where does pt want refill sent?  Will send in thirty days advise pt to make video visit prior to next refill so we can go over adderall , as would have to be seen every three months legally for three months.

## 2022-09-17 ENCOUNTER — Other Ambulatory Visit: Payer: Self-pay

## 2022-09-17 MED ORDER — SERTRALINE HCL 100 MG PO TABS: 100.0000 mg | ORAL_TABLET | Freq: Every day | ORAL | 0 refills | Status: DC

## 2022-09-18 NOTE — Telephone Encounter (Signed)
[facility] is the pharmacy she wants to use

## 2022-09-22 NOTE — Telephone Encounter (Signed)
Please ask pt for a local pharmacy, not mail pharmacy.  Controlled substance level 2, not ok with sending via mail.  Will send once answered.

## 2022-09-25 NOTE — Telephone Encounter (Signed)
Left message to return call to our office.  

## 2022-09-29 NOTE — Telephone Encounter (Signed)
Not sure what is going on here? Looks like she has been out of the medication for over a month now? She just needs a refill sent in? Looks like tabitha was wanting to see her but got rescheduled due to tabitha being out of office?

## 2022-09-29 NOTE — Telephone Encounter (Signed)
Patient notified as instructed by telephone and verbalized understanding. Patient stated that this has been an issue with her insurance company. Patient stated the only way her insurance will cover this medication is thru her mail order pharmacy.

## 2022-10-03 NOTE — Telephone Encounter (Signed)
She just got it filled on 12/15. She should have enough medication to last until [place] is back in office

## 2022-10-03 NOTE — Telephone Encounter (Signed)
Left message to return call to our office.  

## 2022-10-05 NOTE — Telephone Encounter (Signed)
Called and spoke to pt and she stated that she has enough until she see [place] in January.

## 2022-10-23 ENCOUNTER — Encounter: Payer: Self-pay | Admitting: Family

## 2022-10-23 ENCOUNTER — Ambulatory Visit: Payer: Managed Care, Other (non HMO) | Admitting: Family

## 2022-10-23 VITALS — BP 122/80 | HR 90 | Temp 98.2°F | Ht 68.25 in | Wt 207.0 lb

## 2022-10-23 DIAGNOSIS — F411 Generalized anxiety disorder: Secondary | ICD-10-CM

## 2022-10-23 DIAGNOSIS — K582 Mixed irritable bowel syndrome: Secondary | ICD-10-CM

## 2022-10-23 DIAGNOSIS — F902 Attention-deficit hyperactivity disorder, combined type: Secondary | ICD-10-CM

## 2022-10-23 DIAGNOSIS — K219 Gastro-esophageal reflux disease without esophagitis: Secondary | ICD-10-CM

## 2022-10-23 MED ORDER — PANTOPRAZOLE SODIUM 20 MG PO TBEC: 20.0000 mg | DELAYED_RELEASE_TABLET | Freq: Every day | ORAL | 0 refills | Status: DC

## 2022-10-23 NOTE — Assessment & Plan Note (Addendum)
Refill adderall XR 20 mg once daily.  Stable.

## 2022-10-23 NOTE — Assessment & Plan Note (Addendum)
Continue with medications as prescribed.  Stable.

## 2022-10-23 NOTE — Assessment & Plan Note (Signed)
Stop lansoprazole, start pantoprazole 20 mg. Also referral placed for GI for eval/treat ongoing chronic GERD.   Try to decrease and or avoid spicy foods, fried fatty foods, and also caffeine and chocolate as these can increase heartburn symptoms.   D/w pt long term PPI use risks. Pt aware.

## 2022-10-23 NOTE — Patient Instructions (Addendum)
  Start pantoprazole 20 mg once daily.  Continue with prevacid. I do advise that you see GI again for ongoing reflux.    Regards,   Eugenia Pancoast FNP-C

## 2022-10-23 NOTE — Progress Notes (Signed)
Established Patient Office Visit  Subjective:  Patient ID: Erica Wells, female    DOB: 03-02-1981  Age: 42 y.o. MRN: [id number]  CC:  Chief Complaint  Patient presents with   Medication Management    Follow up for Adderall     HPI ITALI MCKENDRY is here today for follow up.   ADHD: on adderall XR 20 mg once daily. Doing well on this. Denies cp palp or sob. Works as a Librarian, academic at a lab, and this helps her focus throughout the day.   GAD: on sertraline 100 mg once daily and takes elavil 10 mg at night time.     03/27/2022    8:49 AM 06/28/2020    8:37 AM  PHQ9 SCORE ONLY  PHQ-9 Total Score 0 3      03/27/2022    8:49 AM  GAD 7 : Generalized Anxiety Score  Nervous, Anxious, on Edge 1  Control/stop worrying 1  Worry too much - different things 0  Trouble relaxing 1  Restless 1  Easily annoyed or irritable 0  Afraid - awful might happen 0  Total GAD 7 Score 4  Anxiety Difficulty Not difficult at all   GERD, not controlled. Taking pepcid 40 mg daily and also prevacid 15 mg once daily. Has tried omeprazole which stopped working. Has tried nexium this did not help her reflux. Was also on zantac in the past. Has had GI workup but no recent f/u. Pt states not really eating fried foods, but does often eat spicy foods and caffeine, one cup of coffee a day.      Past Medical History:  Diagnosis Date   Anemia    YEARS AGO   Anxiety    ASCUS of cervix with negative high risk HPV 06/27/2016   Formatting of this note might be different from the original. Negative HPV 2015 and 2016 Normal Pap 2016 S/p hysterectomy 5732   Complication of anesthesia    GERD (gastroesophageal reflux disease)    PONV (postoperative nausea and vomiting)     Past Surgical History:  Procedure Laterality Date   CHOLECYSTECTOMY  2014   DILATATION & CURETTAGE/HYSTEROSCOPY WITH MYOSURE N/A 09/06/2018   Procedure: DILATATION & CURETTAGE/HYSTEROSCOPY WITH MYOSURE-resection of polyps,submucosal  polyps;  Surgeon: Boykin Nearing, MD;  Location: [facility];  Service: Gynecology;  Laterality: N/A;   HIP ARTHROSCOPY Right 12/03/2017   Procedure: ARTHROSCOPY HIP;  Surgeon: Leim Fabry, MD;  Location: [facility];  Service: Orthopedics;  Laterality: Right;   LAPAROSCOPIC SUPRACERVICAL HYSTERECTOMY N/A 05/09/2019   Procedure: LAPAROSCOPIC SUPRACERVICAL HYSTERECTOMY, Bilateral Salpingectomy;  Surgeon: Schermerhorn, Gwen Her, MD;  Location: [facility];  Service: Gynecology;  Laterality: N/A;    Family History  Problem Relation Age of Onset   Arthritis Mother    Colon polyps Mother    Parkinson's disease Mother    Depression Father    Diabetes Father    Heart disease Father    Non-Hodgkin's lymphoma Father    Alcohol abuse Paternal Grandmother    Heart disease Paternal Grandmother    Heart disease Maternal Grandmother    Colon cancer Maternal Grandfather 109   Diabetes Paternal Grandfather    Breast cancer Maternal Aunt 55    Social History   Socioeconomic History   Marital status: Married    Spouse name: Marcello Moores   Number of children: 0   Years of education: Bachelors degree   Highest education level: Not on file  Occupational History   Not on file  Tobacco Use   Smoking status: Never   Smokeless tobacco: Never  Vaping Use   Vaping Use: Never used  Substance and Sexual Activity   Alcohol use: Yes    Alcohol/week: 3.0 standard drinks of alcohol    Types: 3 Cans of beer per week    Comment: 4 times a week, 1-2 beers   Drug use: No   Sexual activity: Yes    Birth control/protection: Surgical    Comment: one partner  Other Topics Concern   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      Family: good relationship with family - most of her family is in [place]      Enjoys: hiking, pets (25 chickens - gives away eggs, 3 ducks)      Exercise: hiking 4 times a  week 3-4 mile hike   Diet: weight watchers diet - using the app       Safety   Seat belts: Yes    Guns: Yes  and secure   Safe in relationships: Yes    Social Determinants of Radio broadcast assistant Strain: Not on file  Food Insecurity: Not on file  Transportation Needs: Not on file  Physical Activity: Not on file  Stress: Not on file  Social Connections: Not on file  Intimate Partner Violence: Not on file    Outpatient Medications Prior to Visit  Medication Sig Dispense Refill   amitriptyline (ELAVIL) 10 MG tablet TAKE 3 TABLETS BY MOUTH AT  BEDTIME 270 tablet 1   amphetamine-dextroamphetamine (ADDERALL XR) 20 MG 24 hr capsule Take 1 capsule (20 mg total) by mouth every morning. 30 capsule 0   clonazePAM (KLONOPIN) 1 MG tablet Take by mouth.     colestipol (COLESTID) 1 g tablet Take 1 tablet (1 g total) by mouth 2 (two) times daily. 180 tablet 1   Drospirenone (SLYND) 4 MG TABS Take 1 tablet by mouth daily. 90 tablet 3   famotidine (PEPCID) 40 MG tablet Take 1 tablet (40 mg total) by mouth daily. 90 tablet 3   rizatriptan (MAXALT) 10 MG tablet Take 1 tablet (10 mg total) by mouth as needed for migraine. May repeat in 2 hours if needed 10 tablet 0   sertraline (ZOLOFT) 100 MG tablet Take 1 tablet (100 mg total) by mouth at bedtime. Will need to be seen in office for more refills. 90 tablet 0   topiramate (TOPAMAX) 50 MG tablet TAKE 1 TABLET BY MOUTH  TWICE DAILY 180 tablet 1   lansoprazole (PREVACID) 15 MG capsule TAKE 1 CAPSULE BY MOUTH AT  BEDTIME 90 capsule 3   No facility-administered medications prior to visit.    Allergies  Allergen Reactions   Penicillins Anaphylaxis    Has patient had a PCN reaction causing immediate rash, facial/tongue/throat swelling, SOB or lightheadedness with hypotension: Yes Has patient had a PCN reaction causing severe rash involving mucus membranes or skin necrosis: No Has patient had a PCN reaction that required hospitalization: Yes Has  patient had a PCN reaction occurring within the last 10 years: No If all of the above answers are "NO", then may proceed with Cephalosporin use.         Objective:    Physical Exam Constitutional:      Appearance: Normal appearance. She is obese.  Cardiovascular:     Rate and  Rhythm: Normal rate and regular rhythm.  Pulmonary:     Effort: Pulmonary effort is normal.  Abdominal:     General: Abdomen is flat.     Palpations: Abdomen is soft.     Tenderness: There is no abdominal tenderness.  Neurological:     General: No focal deficit present.     Mental Status: She is alert and oriented to person, place, and time. Mental status is at baseline.  Psychiatric:        Mood and Affect: Mood normal.        Behavior: Behavior normal.        Thought Content: Thought content normal.        Judgment: Judgment normal.       BP 122/80   Pulse 90   Temp 98.2 F (36.8 C) (Temporal)   Ht 5' 8.25" (1.734 m)   Wt 207 lb (93.9 kg)   LMP 04/16/2019   SpO2 99%   BMI 31.24 kg/m  Wt Readings from Last 3 Encounters:  10/23/22 207 lb (93.9 kg)  07/10/22 202 lb 6 oz (91.8 kg)  03/27/22 196 lb (88.9 kg)     Health Maintenance Due  Topic Date Due   Hepatitis C Screening  Never done    There are no preventive care reminders to display for this patient.  No results found for: "TSH" Lab Results  Component Value Date   WBC 9.0 05/06/2019   HGB 12.6 05/06/2019   HCT 38.2 05/06/2019   MCV 87.2 05/06/2019   PLT 373 05/06/2019   Lab Results  Component Value Date   NA 137 05/06/2019   K 3.6 05/06/2019   CO2 19 (L) 05/06/2019   GLUCOSE 83 05/06/2019   BUN 11 05/06/2019   CREATININE 0.8 04/24/2022   BILITOT 0.3 11/01/2012   ALKPHOS 71 11/01/2012   AST 18 11/01/2012   ALT 17 11/01/2012   PROT 8.4 (H) 11/01/2012   ALBUMIN 4.1 11/01/2012   CALCIUM 9.2 05/06/2019   ANIONGAP 10 05/06/2019   EGFR 104 04/24/2022   Lab Results  Component Value Date   CHOL 207 (A) 04/24/2022    Lab Results  Component Value Date   HDL 49 04/24/2022   Lab Results  Component Value Date   LDLCALC 141 04/24/2022   Lab Results  Component Value Date   TRIG 94 04/24/2022   No results found for: "CHOLHDL" Lab Results  Component Value Date   HGBA1C 5.3 04/24/2022      Assessment & Plan:   Problem List Items Addressed This Visit       Digestive   GERD (gastroesophageal reflux disease)    Stop lansoprazole, start pantoprazole 20 mg. Also referral placed for GI for eval/treat ongoing chronic GERD.   Try to decrease and or avoid spicy foods, fried fatty foods, and also caffeine and chocolate as these can increase heartburn symptoms.   D/w pt long term PPI use risks. Pt aware.       Relevant Medications   pantoprazole (PROTONIX) 20 MG tablet   Irritable bowel syndrome with both constipation and diarrhea    Recommendation for FODMAP diet and to work on identifying triggers. Fodmap diet sent to mychart.      Relevant Medications   pantoprazole (PROTONIX) 20 MG tablet   Other Relevant Orders   Ambulatory referral to Gastroenterology     Other   ADHD (attention deficit hyperactivity disorder), combined type - Primary    Refill adderall XR 20 mg once  daily.  Stable.      Anxiety, generalized    Continue with medications as prescribed.  Stable.        Meds ordered this encounter  Medications   pantoprazole (PROTONIX) 20 MG tablet    Sig: Take 1 tablet (20 mg total) by mouth daily.    Dispense:  90 tablet    Refill:  0    Order Specific Question:   Supervising Provider    Answer:   BEDSOLE, AMY E [[id number]]    Follow-up: No follow-ups on file.    Eugenia Pancoast, FNP

## 2022-10-23 NOTE — Assessment & Plan Note (Signed)
Recommendation for FODMAP diet and to work on identifying triggers. Fodmap diet sent to mychart.

## 2022-11-12 ENCOUNTER — Encounter: Payer: Self-pay | Admitting: Family

## 2022-11-12 DIAGNOSIS — F902 Attention-deficit hyperactivity disorder, combined type: Secondary | ICD-10-CM

## 2022-11-14 MED ORDER — AMPHETAMINE-DEXTROAMPHET ER 20 MG PO CP24: 20.0000 mg | ORAL_CAPSULE | ORAL | 0 refills | Status: DC

## 2022-11-21 ENCOUNTER — Other Ambulatory Visit: Payer: Self-pay | Admitting: Family

## 2022-11-23 ENCOUNTER — Encounter: Payer: Self-pay | Admitting: Family

## 2022-11-23 ENCOUNTER — Encounter: Payer: Managed Care, Other (non HMO) | Admitting: Family

## 2022-11-23 ENCOUNTER — Ambulatory Visit: Payer: Managed Care, Other (non HMO) | Admitting: Family

## 2022-11-23 VITALS — BP 126/76 | HR 97 | Temp 97.6°F | Ht 68.25 in | Wt 209.2 lb

## 2022-11-23 DIAGNOSIS — E66811 Obesity, class 1: Secondary | ICD-10-CM | POA: Insufficient documentation

## 2022-11-23 DIAGNOSIS — E6609 Other obesity due to excess calories: Secondary | ICD-10-CM | POA: Diagnosis not present

## 2022-11-23 DIAGNOSIS — F902 Attention-deficit hyperactivity disorder, combined type: Secondary | ICD-10-CM | POA: Diagnosis not present

## 2022-11-23 DIAGNOSIS — L700 Acne vulgaris: Secondary | ICD-10-CM | POA: Diagnosis not present

## 2022-11-23 DIAGNOSIS — Z6831 Body mass index (BMI) 31.0-31.9, adult: Secondary | ICD-10-CM

## 2022-11-23 DIAGNOSIS — J309 Allergic rhinitis, unspecified: Secondary | ICD-10-CM

## 2022-11-23 DIAGNOSIS — Z5181 Encounter for therapeutic drug level monitoring: Secondary | ICD-10-CM

## 2022-11-23 DIAGNOSIS — F411 Generalized anxiety disorder: Secondary | ICD-10-CM

## 2022-11-23 DIAGNOSIS — N809 Endometriosis, unspecified: Secondary | ICD-10-CM

## 2022-11-23 MED ORDER — AZELASTINE-FLUTICASONE 137-50 MCG/ACT NA SUSP: 1.0000 | Freq: Two times a day (BID) | NASAL | 5 refills | Status: DC

## 2022-11-23 NOTE — Progress Notes (Signed)
Established Patient Office Visit  Subjective:  Patient ID: Erica Wells, female    DOB: Sep 20, 1981  Age: 42 y.o. MRN: [id number]  CC:  Chief Complaint  Patient presents with   Transitions Of Care    TOC from Dr. Einar Pheasant.    HPI Erica Wells is here for a transition of care visit.  Prior provider was: Dr. Waunita Schooner    Pt is with acute concerns.  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   Anxiety state, GAD: very rarely takes klonipin, does very well on sertraline.   Migraine without aura: controlled well with Topamax 50 mg also elavil 10 mg which also helps her for sleeping. . Depends on triggers specifically sinus problem, only finds she has then once or twice a month. At worse will take rizatriptan.   Heartburn: was taking pepcid and also omeprazole. Changed to pantoprazole 20 mg once daily. Tries to limit her fried fatty foods to being without a gallbladder and with IBS so constantly balancing her right foods. Has appt with GI April 2024 for consult.   Past Medical History:  Diagnosis Date   Anemia    YEARS AGO   Anxiety    ASCUS of cervix with negative high risk HPV 06/27/2016   Formatting of this note might be different from the original. Negative HPV 2015 and 2016 Normal Pap 2016 S/p hysterectomy 3785   Complication of anesthesia    GERD (gastroesophageal reflux disease)    PONV (postoperative nausea and vomiting)     Past Surgical History:  Procedure Laterality Date   CHOLECYSTECTOMY  2014   DILATATION & CURETTAGE/HYSTEROSCOPY WITH MYOSURE N/A 09/06/2018   Procedure: DILATATION & CURETTAGE/HYSTEROSCOPY WITH MYOSURE-resection of polyps,submucosal polyps;  Surgeon: Boykin Nearing, MD;  Location: [facility];  Service: Gynecology;  Laterality: N/A;   HIP ARTHROSCOPY Right 12/03/2017   Procedure: ARTHROSCOPY  HIP;  Surgeon: Leim Fabry, MD;  Location: [facility];  Service: Orthopedics;  Laterality: Right;   LAPAROSCOPIC SUPRACERVICAL HYSTERECTOMY N/A 05/09/2019   Procedure: LAPAROSCOPIC SUPRACERVICAL HYSTERECTOMY, Bilateral Salpingectomy;  Surgeon: Schermerhorn, Gwen Her, MD;  Location: [facility];  Service: Gynecology;  Laterality: N/A;    Family History  Problem Relation Age of Onset   Arthritis Mother    Colon polyps Mother    Parkinson's disease Mother    Depression Father    Diabetes Father    Heart disease Father    Non-Hodgkin's lymphoma Father    Alcohol abuse Paternal Grandmother    Heart disease Paternal Grandmother    Heart disease Maternal Grandmother    Colon cancer Maternal Grandfather 83   Diabetes Paternal Grandfather    Breast cancer Maternal Aunt 55    Social History   Socioeconomic History   Marital status: Married    Spouse name: Marcello Moores   Number of children: 0   Years of education: Bachelors degree   Highest education level: Not on file  Occupational History    Employer: [employer]  Tobacco Use   Smoking status: Never   Smokeless tobacco: Never  Vaping Use   Vaping Use: Never used  Substance and Sexual Activity   Alcohol use: Yes    Alcohol/week: 3.0 standard drinks of alcohol    Types: 3 Cans of beer per week    Comment: 4 times a week, 1-2 beers   Drug  use: No   Sexual activity: Yes    Birth control/protection: Surgical    Comment: one partner  Other Topics Concern   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      Family: good relationship with family - most of her family is in [place]      Enjoys: hiking, pets (25 chickens - gives away eggs, 3 ducks)      Exercise: hiking 4 times a week 3-4 mile hike   Diet: weight watchers diet - using the app       Safety   Seat belts: Yes    Guns: Yes  and secure   Safe in relationships: Yes     Social Determinants of Radio broadcast assistant Strain: Not on file  Food Insecurity: Not on file  Transportation Needs: Not on file  Physical Activity: Not on file  Stress: Not on file  Social Connections: Not on file  Intimate Partner Violence: Not on file    Outpatient Medications Prior to Visit  Medication Sig Dispense Refill   amitriptyline (ELAVIL) 10 MG tablet TAKE 3 TABLETS BY MOUTH AT  BEDTIME 270 tablet 1   amphetamine-dextroamphetamine (ADDERALL XR) 20 MG 24 hr capsule Take 1 capsule (20 mg total) by mouth every morning. 30 capsule 0   clonazePAM (KLONOPIN) 1 MG tablet Take by mouth.     colestipol (COLESTID) 1 g tablet Take 1 tablet (1 g total) by mouth 2 (two) times daily. 180 tablet 1   Drospirenone (SLYND) 4 MG TABS Take 1 tablet by mouth daily. 90 tablet 3   naproxen sodium (ALEVE) 220 MG tablet Take 220 mg by mouth daily as needed.     pantoprazole (PROTONIX) 20 MG tablet Take 1 tablet (20 mg total) by mouth daily. 90 tablet 0   rizatriptan (MAXALT) 10 MG tablet Take 1 tablet (10 mg total) by mouth as needed for migraine. May repeat in 2 hours if needed 10 tablet 0   sertraline (ZOLOFT) 100 MG tablet Take 1 tablet (100 mg total) by mouth at bedtime. Will need to be seen in office for more refills. 90 tablet 0   topiramate (TOPAMAX) 50 MG tablet TAKE 1 TABLET BY MOUTH  TWICE DAILY 180 tablet 1   famotidine (PEPCID) 40 MG tablet Take 1 tablet (40 mg total) by mouth daily. 90 tablet 3   No facility-administered medications prior to visit.    Allergies  Allergen Reactions   Penicillins Anaphylaxis    Has patient had a PCN reaction causing immediate rash, facial/tongue/throat swelling, SOB or lightheadedness with hypotension: Yes Has patient had a PCN reaction causing severe rash involving mucus membranes or skin necrosis: No Has patient had a PCN reaction that required hospitalization: Yes Has patient had a PCN reaction occurring within the last 10 years: No If  all of the above answers are "NO", then may proceed with Cephalosporin use.     ROS: Pertinent symptoms negative unless otherwise noted in HPI     Objective:    Physical Exam Vitals reviewed.  Constitutional:      Appearance: Normal appearance.  HENT:     Nose: Mucosal edema and congestion present. No nasal tenderness.     Right Turbinates: Enlarged, swollen and pale.     Right Sinus: No maxillary sinus tenderness or frontal sinus tenderness.  Left Sinus: No maxillary sinus tenderness or frontal sinus tenderness.  Eyes:     General:        Right eye: No discharge.        Left eye: No discharge.     Conjunctiva/sclera: Conjunctivae normal.  Cardiovascular:     Rate and Rhythm: Normal rate.  Pulmonary:     Effort: Pulmonary effort is normal. No respiratory distress.  Musculoskeletal:        General: Normal range of motion.     Cervical back: Normal range of motion.  Neurological:     General: No focal deficit present.     Mental Status: She is alert and oriented to person, place, and time. Mental status is at baseline.  Psychiatric:        Mood and Affect: Mood normal.        Behavior: Behavior normal.        Thought Content: Thought content normal.        Judgment: Judgment normal.       BP 126/76   Pulse 97   Temp 97.6 F (36.4 C) (Temporal)   Ht 5' 8.25" (1.734 m)   Wt 209 lb 4 oz (94.9 kg)   LMP 04/16/2019   SpO2 98%   BMI 31.58 kg/m  Wt Readings from Last 3 Encounters:  11/23/22 209 lb 4 oz (94.9 kg)  10/23/22 207 lb (93.9 kg)  07/10/22 202 lb 6 oz (91.8 kg)     Health Maintenance Due  Topic Date Due   Hepatitis C Screening  Never done   COVID-19 Vaccine (4 - 2023-24 season) 06/16/2022    There are no preventive care reminders to display for this patient.  No results found for: "TSH" Lab Results  Component Value Date   WBC 9.0 05/06/2019   HGB 12.6 05/06/2019   HCT 38.2 05/06/2019   MCV 87.2 05/06/2019   PLT 373 05/06/2019   Lab  Results  Component Value Date   NA 137 05/06/2019   K 3.6 05/06/2019   CO2 19 (L) 05/06/2019   GLUCOSE 83 05/06/2019   BUN 11 05/06/2019   CREATININE 0.8 04/24/2022   BILITOT 0.3 11/01/2012   ALKPHOS 71 11/01/2012   AST 18 11/01/2012   ALT 17 11/01/2012   PROT 8.4 (H) 11/01/2012   ALBUMIN 4.1 11/01/2012   CALCIUM 9.2 05/06/2019   ANIONGAP 10 05/06/2019   EGFR 104 04/24/2022   Lab Results  Component Value Date   CHOL 207 (A) 04/24/2022   Lab Results  Component Value Date   HDL 49 04/24/2022   Lab Results  Component Value Date   LDLCALC 141 04/24/2022   Lab Results  Component Value Date   TRIG 94 04/24/2022   No results found for: "CHOLHDL" Lab Results  Component Value Date   HGBA1C 5.3 04/24/2022      Assessment & Plan:   Anxiety, generalized Assessment & Plan: Stable.  Continue sertraline.    ADHD (attention deficit hyperactivity disorder), combined type Assessment & Plan: Up to date on current refills. Continue adderall 20 mg XR UDS today  Updated non opioid controlled substance contract     Class 1 obesity due to excess calories without serious comorbidity with body mass index (BMI) of 31.0 to 31.9 in adult Assessment & Plan: Pt advised to work on diet and exercise as tolerated    Acne vulgaris Assessment & Plan: Improved slightly with ocp    Endometriosis Assessment & Plan: Stable with ocp doing  much better.    Allergic rhinitis, unspecified seasonality, unspecified trigger -     Azelastine-Fluticasone; Place 1 spray into the nose every 12 (twelve) hours.  Dispense: 23 g; Refill: 5  Encounter for therapeutic drug level monitoring -     Drug Monitoring Panel [phone] , Urine    Meds ordered this encounter  Medications   Azelastine-Fluticasone 137-50 MCG/ACT SUSP    Sig: Place 1 spray into the nose every 12 (twelve) hours.    Dispense:  23 g    Refill:  5    Order Specific Question:   Supervising Provider    Answer:   Diona Browner, AMY  E [[id number]]    Follow-up: Return in about 3 months (around 02/21/2023) for can be virtual f/u ADHD medication .    Eugenia Pancoast, FNP

## 2022-11-23 NOTE — Assessment & Plan Note (Addendum)
Improved slightly with ocp

## 2022-11-23 NOTE — Assessment & Plan Note (Signed)
Stable with ocp doing much better.

## 2022-11-23 NOTE — Assessment & Plan Note (Addendum)
Up to date on current refills. Continue adderall 20 mg XR UDS today  Updated non opioid controlled substance contract

## 2022-11-23 NOTE — Addendum Note (Signed)
Addended by: Ellamae Sia on: 11/23/2022 12:24 PM   Modules accepted: Orders

## 2022-11-23 NOTE — Assessment & Plan Note (Signed)
Pt advised to work on diet and exercise as tolerated  

## 2022-11-23 NOTE — Patient Instructions (Signed)
  Welcome to our clinic, I am happy to have you as my new patient. I am excited to continue on this healthcare journey with you.  ------------------------------------  Stop by the lab prior to leaving today. I will notify you of your results once received.   Please keep in mind Any my chart messages you send have up to a three business day turnaround for a response.  Phone calls may take up to a one full business day turnaround for a  response.   If you need a medication refill I recommend you request it through the pharmacy as this is easiest for [place] rather than sending a message and or phone call.   Due to recent changes in healthcare laws, you may see results of your imaging and/or laboratory studies on MyChart before I have had a chance to review them.  I understand that in some cases there may be results that are confusing or concerning to you. Please understand that not all results are received at the same time and often I may need to interpret multiple results in order to provide you with the best plan of care or course of treatment. Therefore, I ask that you please give me 2 business days to thoroughly review all your results before contacting my office for clarification. Should we see a critical lab result, you will be contacted sooner.   It was a pleasure seeing you today! Please do not hesitate to reach out with any questions and or concerns.  Regards,   Eugenia Pancoast FNP-C

## 2022-11-23 NOTE — Assessment & Plan Note (Signed)
Stable Continue sertraline 

## 2022-11-27 ENCOUNTER — Other Ambulatory Visit: Payer: Self-pay

## 2022-11-27 LAB — DRUG PROFILE, UR, 9 DRUGS (LABCORP)
Amphetamines, Urine: POSITIVE — AB
Barbiturate Quant, Ur: NEGATIVE ng/mL
Benzodiazepine Quant, Ur: NEGATIVE ng/mL
Cannabinoid Quant, Ur: NEGATIVE ng/mL
Cocaine (Metab.): NEGATIVE ng/mL
Methadone Screen, Urine: NEGATIVE ng/mL
Opiate Quant, Ur: NEGATIVE ng/mL
PCP Quant, Ur: NEGATIVE ng/mL
Propoxyphene: NEGATIVE ng/mL

## 2022-11-27 MED ORDER — SERTRALINE HCL 100 MG PO TABS: 100.0000 mg | ORAL_TABLET | Freq: Every day | ORAL | 0 refills | Status: DC

## 2022-12-18 ENCOUNTER — Encounter: Payer: Self-pay | Admitting: Family

## 2022-12-18 DIAGNOSIS — F902 Attention-deficit hyperactivity disorder, combined type: Secondary | ICD-10-CM

## 2022-12-18 MED ORDER — AMPHETAMINE-DEXTROAMPHET ER 20 MG PO CP24: 20.0000 mg | ORAL_CAPSULE | ORAL | 0 refills | Status: DC

## 2022-12-24 ENCOUNTER — Other Ambulatory Visit: Payer: Self-pay | Admitting: Family

## 2022-12-24 DIAGNOSIS — K219 Gastro-esophageal reflux disease without esophagitis: Secondary | ICD-10-CM

## 2023-01-28 ENCOUNTER — Other Ambulatory Visit: Payer: Self-pay | Admitting: Family

## 2023-02-03 ENCOUNTER — Other Ambulatory Visit: Payer: Self-pay | Admitting: Obstetrics and Gynecology

## 2023-02-08 ENCOUNTER — Other Ambulatory Visit: Payer: Self-pay

## 2023-02-08 DIAGNOSIS — J309 Allergic rhinitis, unspecified: Secondary | ICD-10-CM

## 2023-02-08 NOTE — Telephone Encounter (Signed)
Azelastine-Fluticasone 137-50 MCG/ACT SUSP  topiramate (TOPAMAX) 50 MG tablet   LV- 01/26/23 NV- Not Scheduled

## 2023-02-09 MED ORDER — AZELASTINE-FLUTICASONE 137-50 MCG/ACT NA SUSP: 1.0000 | Freq: Two times a day (BID) | NASAL | 5 refills | Status: AC

## 2023-02-09 MED ORDER — TOPIRAMATE 50 MG PO TABS: 50.0000 mg | ORAL_TABLET | Freq: Two times a day (BID) | ORAL | 1 refills | Status: DC

## 2023-02-12 ENCOUNTER — Ambulatory Visit: Payer: Managed Care, Other (non HMO) | Admitting: Gastroenterology

## 2023-02-12 ENCOUNTER — Other Ambulatory Visit: Payer: Self-pay

## 2023-02-12 ENCOUNTER — Encounter: Payer: Self-pay | Admitting: Gastroenterology

## 2023-02-12 VITALS — BP 122/81 | HR 98 | Temp 97.3°F | Ht 69.0 in | Wt 209.8 lb

## 2023-02-12 DIAGNOSIS — K9089 Other intestinal malabsorption: Secondary | ICD-10-CM

## 2023-02-12 DIAGNOSIS — K219 Gastro-esophageal reflux disease without esophagitis: Secondary | ICD-10-CM | POA: Diagnosis not present

## 2023-02-12 DIAGNOSIS — Z83719 Family history of colon polyps, unspecified: Secondary | ICD-10-CM

## 2023-02-12 MED ORDER — NA SULFATE-K SULFATE-MG SULF 17.5-3.13-1.6 GM/177ML PO SOLN: 354.0000 mL | Freq: Once | ORAL | 0 refills | Status: AC

## 2023-02-12 MED ORDER — CHOLESTYRAMINE 4 GM/DOSE PO POWD: 4.0000 g | Freq: Two times a day (BID) | ORAL | 11 refills | Status: DC

## 2023-02-12 MED ORDER — FAMOTIDINE 40 MG PO TABS: 40.0000 mg | ORAL_TABLET | Freq: Every day | ORAL | 3 refills | Status: DC

## 2023-02-12 NOTE — Patient Instructions (Addendum)

## 2023-02-12 NOTE — Progress Notes (Signed)
[name] MD, MRCP(U.K) [address]  [address]  [place], [place] [address]  Main: [phone]  Fax: [phone]   Gastroenterology Consultation  Referring Provider:     Mort Sawyers, FNP Primary Care Physician:  Erica Sawyers, FNP Primary Gastroenterologist:  Dr. [name]  Reason for Consultation:    IBS,GERD        HPI:   Erica Wells is a 42 y.o. y/o female referred for consultation & management  by  Erica Sawyers, FNP.   She says she is here to see me to discuss about her longstanding history of IBS flipping between constipation and diarrhea but she has been having more diarrhea since she had a gallbladder surgery for a few years no recent change in her symptoms she has been taking colestipol for her diarrhea but it either makes her too constipated when she takes a full dose or when she avoids the medication gets her diarrhea.  She has never tried Runner, broadcasting/film/video.  No other change in her bowel symptoms.  Her mother had colon polyps in her 75s and would like to get a colonoscopy.  She also has a longstanding history of reflux she has been on a PPI such as pantoprazole and Prilosec for over 9 years.  She takes 40 mg at nighttime before dinner and she works night shifts.  If she misses a dose she gets significant symptoms.  She has not gained or lost weight recently.  No family history of esophageal cancer.  Denies smoking No recent labs or imaging.    Past Medical History:  Diagnosis Date   Anemia    YEARS AGO   Anxiety    ASCUS of cervix with negative high risk HPV 06/27/2016   Formatting of this note might be different from the original. Negative HPV 2015 and 2016 Normal Pap 2016 S/p hysterectomy 2020   Complication of anesthesia    GERD (gastroesophageal reflux disease)    PONV (postoperative nausea and vomiting)     Past Surgical History:  Procedure Laterality Date   CHOLECYSTECTOMY  2014   DILATATION & CURETTAGE/HYSTEROSCOPY WITH MYOSURE N/A 09/06/2018    Procedure: DILATATION & CURETTAGE/HYSTEROSCOPY WITH MYOSURE-resection of polyps,submucosal polyps;  Surgeon: Suzy Bouchard, MD;  Location: [facility];  Service: Gynecology;  Laterality: N/A;   HIP ARTHROSCOPY Right 12/03/2017   Procedure: ARTHROSCOPY HIP;  Surgeon: Signa Kell, MD;  Location: [facility];  Service: Orthopedics;  Laterality: Right;   LAPAROSCOPIC SUPRACERVICAL HYSTERECTOMY N/A 05/09/2019   Procedure: LAPAROSCOPIC SUPRACERVICAL HYSTERECTOMY, Bilateral Salpingectomy;  Surgeon: Schermerhorn, Ihor Austin, MD;  Location: [facility];  Service: Gynecology;  Laterality: N/A;    Prior to Admission medications   Medication Sig Start Date End Date Taking? Authorizing Provider  amitriptyline (ELAVIL) 10 MG tablet TAKE 3 TABLETS BY MOUTH AT  BEDTIME 05/31/22   Gweneth Dimitri, MD  amphetamine-dextroamphetamine (ADDERALL XR) 20 MG 24 hr capsule Take 1 capsule (20 mg total) by mouth every morning. 12/18/22   Erica Sawyers, FNP  amphetamine-dextroamphetamine (ADDERALL XR) 20 MG 24 hr capsule Take 1 capsule (20 mg total) by mouth every morning. 01/18/23   Erica Sawyers, FNP  Azelastine-Fluticasone 137-50 MCG/ACT SUSP Place 1 spray into the nose every 12 (twelve) hours. 02/09/23   Erica Sawyers, FNP  clonazePAM (KLONOPIN) 1 MG tablet Take by mouth. 12/08/18   [provider]  colestipol (COLESTID) 1 g tablet Take 1 tablet (1 g total) by mouth 2 (two) times daily. 03/27/22   Gweneth Dimitri, MD  Drospirenone (SLYND) 4 MG TABS Take 1 tablet by mouth daily. 03/22/22   Doyle Bing, MD  naproxen sodium (ALEVE) 220 MG tablet Take 220 mg by mouth daily as needed.    [provider]  pantoprazole (PROTONIX) 20 MG tablet TAKE 1 TABLET BY MOUTH DAILY 12/25/22   Erica Sawyers, FNP  rizatriptan (MAXALT) 10 MG tablet Take 1 tablet (10 mg total) by mouth as needed for migraine. May repeat in 2 hours if needed 07/10/22   Gweneth Dimitri, MD  sertraline (ZOLOFT) 100 MG tablet TAKE 1 TABLET BY MOUTH AT   BEDTIME 01/29/23   Erica Sawyers, FNP  topiramate (TOPAMAX) 50 MG tablet Take 1 tablet (50 mg total) by mouth 2 (two) times daily. 02/09/23   Erica Sawyers, FNP    Family History  Problem Relation Age of Onset   Arthritis Mother    Colon polyps Mother    Parkinson's disease Mother    Depression Father    Diabetes Father    Heart disease Father    Non-Hodgkin's lymphoma Father    Alcohol abuse Paternal Grandmother    Heart disease Paternal Grandmother    Heart disease Maternal Grandmother    Colon cancer Maternal Grandfather 65   Diabetes Paternal Grandfather    Breast cancer Maternal Aunt 60     Social History   Tobacco Use   Smoking status: Never   Smokeless tobacco: Never  Vaping Use   Vaping Use: Never used  Substance Use Topics   Alcohol use: Yes    Alcohol/week: 3.0 standard drinks of alcohol    Types: 3 Cans of beer per week    Comment: 4 times a week, 1-2 beers   Drug use: No    Allergies as of 02/12/2023 - Review Complete 02/12/2023  Allergen Reaction Noted   Penicillins Anaphylaxis 11/14/2017    Review of Systems:    All systems reviewed and negative except where noted in HPI.   Physical Exam:  BP 122/81   Pulse 98   Temp (!) 97.3 F (36.3 C) (Oral)   Ht 5\' 9"  (1.753 m)   Wt 209 lb 12.8 oz (95.2 kg)   LMP 04/16/2019   BMI 30.98 kg/m  Patient's last menstrual period was 04/16/2019. Psych:  Alert and cooperative. Normal mood and affect. General:   Alert,  Well-developed, well-nourished, pleasant and cooperative in NAD Head:  Normocephalic and atraumatic. Eyes:  Sclera clear, no icterus.   Conjunctiva pink. Ears:  Normal auditory acuity.  Neurologic:  Alert and oriented x3;  grossly normal neurologically. Psych:  Alert and cooperative. Normal mood and affect.  Imaging Studies: No results found.  Assessment and Plan:   Erica Wells is a 42 y.o. y/o female has been referred for IBS.  I believe her symptoms of diarrhea are more likely bile salt  mediated diarrhea which she clearly says began after her cholecystectomy and she has had constipation prior to that which may be chronic idiopathic.  She has previously had a EGD and colonoscopy many years back.  Presently also has reflux and has been on a PPI for many years.  Plan 1.  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  She can stop the colestipol.  2.  Family history of colon polyps  in her mother in her 15s we will schedule her for a colonoscopy  3.  Longstanding history of GERD been on a PPI for over 9 years discussed in view of the potential side effects from long-term use will give her a trial of discontinuing her PPI and commencing her on Pepcid 40 mg once a day at night.  If she is unable to tolerate it we need to strongly consider antireflux therapy such as surgery or the TIF procedure.  Will proceed with upper endoscopy to screen for Barrett's esophagus and evaluate her valve.  4.  GERD patient information has been provided and discussed about lifestyle measures use of a wedge pillow losing weight timing of her meals.   I have discussed alternative options, risks & benefits,  which include, but are not limited to, bleeding, infection, perforation,respiratory complication & drug reaction.  The patient agrees with this plan & written consent will be obtained.    Follow up in 4 months  Dr [name] MD,MRCP(U.K)

## 2023-02-19 ENCOUNTER — Ambulatory Visit: Payer: Managed Care, Other (non HMO) | Admitting: Family

## 2023-02-19 ENCOUNTER — Encounter: Payer: Self-pay | Admitting: Family

## 2023-02-19 DIAGNOSIS — F902 Attention-deficit hyperactivity disorder, combined type: Secondary | ICD-10-CM

## 2023-02-19 MED ORDER — AMPHETAMINE-DEXTROAMPHETAMINE 10 MG PO TABS
ORAL_TABLET | ORAL | 0 refills | Status: DC
Start: 2023-02-19 — End: 2023-04-09

## 2023-02-19 MED ORDER — AMPHETAMINE-DEXTROAMPHET ER 20 MG PO CP24
20.0000 mg | ORAL_CAPSULE | ORAL | 0 refills | Status: DC
Start: 2023-02-19 — End: 2023-04-09

## 2023-02-19 NOTE — Progress Notes (Signed)
Established Patient Office Visit  Subjective:      CC:  Chief Complaint  Patient presents with   Medical Management of Chronic Issues    HPI: Erica Wells is a 42 y.o. female presenting on 02/19/2023 for Medical Management of Chronic Issues . ADHD: on adderall XR 20 mg once daily. Helps her focus at work, still doing well.  Denies cp palp and or sob.   Colonoscopy and EGD scheduled scheduled for next week. Seeing GI Dr. Tobi Bastos currently for colon cancer family history as well as ongoing longstanding GERD symptoms.   Left shoulder, suspicious for SLAP tera, posterior labral tear. She is currently seeing Dr. Allena Katz the ortho surgeon.      Social history:  Relevant past medical, surgical, family and social history reviewed and updated as indicated. Interim medical history since our last visit reviewed.  Allergies and medications reviewed and updated.  DATA REVIEWED: CHART IN EPIC     ROS: Negative unless specifically indicated above in HPI.    Current Outpatient Medications:    amitriptyline (ELAVIL) 10 MG tablet, TAKE 3 TABLETS BY MOUTH AT  BEDTIME, Disp: 270 tablet, Rfl: 1   amphetamine-dextroamphetamine (ADDERALL) 10 MG tablet, Take one po qd prn around 1-2 pm, Disp: 30 tablet, Rfl: 0   Azelastine-Fluticasone 137-50 MCG/ACT SUSP, Place 1 spray into the nose every 12 (twelve) hours., Disp: 23 g, Rfl: 5   cholestyramine (QUESTRAN) 4 GM/DOSE powder, Take 1 packet (4 g total) by mouth 2 (two) times daily with a meal., Disp: 240 g, Rfl: 11   clonazePAM (KLONOPIN) 1 MG tablet, Take by mouth., Disp: , Rfl:    Drospirenone (SLYND) 4 MG TABS, Take 1 tablet by mouth daily., Disp: 90 tablet, Rfl: 3   famotidine (PEPCID) 40 MG tablet, Take 1 tablet (40 mg total) by mouth daily., Disp: 90 tablet, Rfl: 3   naproxen sodium (ALEVE) 220 MG tablet, Take 220 mg by mouth daily as needed., Disp: , Rfl:    rizatriptan (MAXALT) 10 MG tablet, Take 1 tablet (10 mg total) by mouth as needed  for migraine. May repeat in 2 hours if needed, Disp: 10 tablet, Rfl: 0   sertraline (ZOLOFT) 100 MG tablet, TAKE 1 TABLET BY MOUTH AT  BEDTIME, Disp: 90 tablet, Rfl: 3   topiramate (TOPAMAX) 50 MG tablet, Take 1 tablet (50 mg total) by mouth 2 (two) times daily., Disp: 180 tablet, Rfl: 1   amphetamine-dextroamphetamine (ADDERALL XR) 20 MG 24 hr capsule, Take 1 capsule (20 mg total) by mouth every morning., Disp: 30 capsule, Rfl: 0      Objective:    BP 118/68 (BP Location: Right Arm)   Pulse 100   Temp 97.8 F (36.6 C) (Temporal)   Ht 5\' 9"  (1.753 m)   Wt 208 lb 9.6 oz (94.6 kg)   LMP 04/16/2019   SpO2 99%   BMI 30.80 kg/m   Wt Readings from Last 3 Encounters:  02/19/23 208 lb 9.6 oz (94.6 kg)  02/12/23 209 lb 12.8 oz (95.2 kg)  11/23/22 209 lb 4 oz (94.9 kg)    Physical Exam Constitutional:      General: She is not in acute distress.    Appearance: Normal appearance. She is normal weight. She is not ill-appearing, toxic-appearing or diaphoretic.  HENT:     Head: Normocephalic.  Cardiovascular:     Rate and Rhythm: Normal rate and regular rhythm.  Pulmonary:     Effort: Pulmonary effort is normal.  Breath sounds: Normal breath sounds.  Musculoskeletal:        General: Normal range of motion.  Neurological:     General: No focal deficit present.     Mental Status: She is alert and oriented to person, place, and time. Mental status is at baseline.  Psychiatric:        Mood and Affect: Mood normal.        Behavior: Behavior normal.        Thought Content: Thought content normal.        Judgment: Judgment normal.            Assessment & Plan:  ADHD (attention deficit hyperactivity disorder), combined type Assessment & Plan: Refill adderall 20 mg XR once daily  Add on adderall 10 mg IR once daily around 1-2 pm  Pdmp reviewed.  Orders: -     Amphetamine-Dextroamphetamine; Take one po qd prn around 1-2 pm  Dispense: 30 tablet; Refill: 0 -      Amphetamine-Dextroamphet ER; Take 1 capsule (20 mg total) by mouth every morning.  Dispense: 30 capsule; Refill: 0     Return in about 3 months (around 05/22/2023) for f/u ADD medication .  Mort Sawyers, MSN, APRN, FNP-C [facility]

## 2023-02-19 NOTE — Assessment & Plan Note (Signed)
Refill adderall 20 mg XR once daily  Add on adderall 10 mg IR once daily around 1-2 pm  Pdmp reviewed.

## 2023-02-22 ENCOUNTER — Encounter: Payer: Self-pay | Admitting: Gastroenterology

## 2023-02-22 ENCOUNTER — Encounter: Payer: Self-pay | Admitting: Obstetrics and Gynecology

## 2023-02-22 ENCOUNTER — Other Ambulatory Visit: Payer: Self-pay | Admitting: *Deleted

## 2023-02-22 MED ORDER — NA SULFATE-K SULFATE-MG SULF 17.5-3.13-1.6 GM/177ML PO SOLN: 354.0000 mL | Freq: Once | ORAL | 0 refills | Status: AC

## 2023-02-22 MED ORDER — SLYND 4 MG PO TABS: 1.0000 | ORAL_TABLET | Freq: Every day | ORAL | 1 refills | Status: DC

## 2023-02-22 NOTE — Addendum Note (Signed)
Addended by: Adela Ports on: 02/22/2023 03:02 PM   Modules accepted: Orders

## 2023-02-28 ENCOUNTER — Encounter: Payer: Self-pay | Admitting: Gastroenterology

## 2023-03-01 ENCOUNTER — Ambulatory Visit: Payer: Managed Care, Other (non HMO) | Admitting: Certified Registered"

## 2023-03-01 ENCOUNTER — Encounter
Admission: RE | Disposition: A | Discharge status: Home / Self Care | Payer: Self-pay | Source: Home / Self Care | Attending: Gastroenterology

## 2023-03-01 ENCOUNTER — Ambulatory Visit
Admission: RE | Admit: 2023-03-01 | Discharge: 2023-03-01 | Disposition: A | Discharge status: Home / Self Care | Payer: Managed Care, Other (non HMO) | Attending: Gastroenterology | Admitting: Gastroenterology

## 2023-03-01 ENCOUNTER — Encounter: Payer: Self-pay | Admitting: Gastroenterology

## 2023-03-01 DIAGNOSIS — Z1211 Encounter for screening for malignant neoplasm of colon: Secondary | ICD-10-CM | POA: Diagnosis present

## 2023-03-01 DIAGNOSIS — Z79899 Other long term (current) drug therapy: Secondary | ICD-10-CM | POA: Diagnosis not present

## 2023-03-01 DIAGNOSIS — K219 Gastro-esophageal reflux disease without esophagitis: Secondary | ICD-10-CM | POA: Diagnosis not present

## 2023-03-01 DIAGNOSIS — F419 Anxiety disorder, unspecified: Secondary | ICD-10-CM | POA: Insufficient documentation

## 2023-03-01 DIAGNOSIS — Z83719 Family history of colon polyps, unspecified: Secondary | ICD-10-CM | POA: Diagnosis not present

## 2023-03-01 DIAGNOSIS — K9089 Other intestinal malabsorption: Secondary | ICD-10-CM

## 2023-03-01 HISTORY — PX: ESOPHAGOGASTRODUODENOSCOPY (EGD) WITH PROPOFOL: SHX5813

## 2023-03-01 HISTORY — PX: COLONOSCOPY WITH PROPOFOL: SHX5780

## 2023-03-01 SURGERY — ESOPHAGOGASTRODUODENOSCOPY (EGD) WITH PROPOFOL
Anesthesia: General

## 2023-03-01 MED ORDER — SODIUM CHLORIDE 0.9 % IV SOLN: INTRAVENOUS | Status: DC

## 2023-03-01 MED ORDER — LIDOCAINE HCL (CARDIAC) PF 100 MG/5ML IV SOSY
PREFILLED_SYRINGE | INTRAVENOUS | Status: DC | PRN
  Administered 2023-03-01: 100 mg via INTRAVENOUS

## 2023-03-01 MED ORDER — PROPOFOL 10 MG/ML IV BOLUS
INTRAVENOUS | Status: DC | PRN
  Administered 2023-03-01: 80 mg via INTRAVENOUS

## 2023-03-01 MED ORDER — PROPOFOL 500 MG/50ML IV EMUL
INTRAVENOUS | Status: DC | PRN
  Administered 2023-03-01: 150 ug/kg/min via INTRAVENOUS

## 2023-03-01 MED ORDER — STERILE WATER FOR IRRIGATION IR SOLN
Status: DC | PRN
  Administered 2023-03-01: 100 mL

## 2023-03-01 NOTE — Anesthesia Preprocedure Evaluation (Signed)
Anesthesia Evaluation  Patient identified by MRN, date of birth, ID band Patient awake    Reviewed: Allergy & Precautions, NPO status , Patient's Chart, lab work & pertinent test results  History of Anesthesia Complications (+) PONV and history of anesthetic complications  Airway Mallampati: III  TM Distance: >3 FB Neck ROM: full    Dental  (+) Chipped   Pulmonary neg pulmonary ROS, neg shortness of breath   Pulmonary exam normal        Cardiovascular Exercise Tolerance: Good (-) angina negative cardio ROS Normal cardiovascular exam     Neuro/Psych  Headaches PSYCHIATRIC DISORDERS         GI/Hepatic Neg liver ROS,GERD  Controlled,,  Endo/Other  negative endocrine ROS    Renal/GU negative Renal ROS  negative genitourinary   Musculoskeletal   Abdominal   Peds  Hematology negative hematology ROS (+)   Anesthesia Other Findings Past Medical History: No date: Anemia     Comment:  YEARS AGO No date: Anxiety 06/27/2016: ASCUS of cervix with negative high risk HPV     Comment:  Formatting of this note might be different from the               original. Negative HPV 2015 and 2016 Normal Pap 2016 S/p               hysterectomy 2020 No date: Complication of anesthesia No date: GERD (gastroesophageal reflux disease) No date: PONV (postoperative nausea and vomiting)  Past Surgical History: No date: ABDOMINAL HYSTERECTOMY 2014: CHOLECYSTECTOMY 09/06/2018: DILATATION & CURETTAGE/HYSTEROSCOPY WITH MYOSURE; N/A     Comment:  Procedure: DILATATION & CURETTAGE/HYSTEROSCOPY WITH               MYOSURE-resection of polyps,submucosal polyps;  Surgeon:               Suzy Bouchard, MD;  Location: [facility] ORS;                Service: Gynecology;  Laterality: N/A; 12/03/2017: HIP ARTHROSCOPY; Right     Comment:  Procedure: ARTHROSCOPY HIP;  Surgeon: Signa Kell, MD;               Location: [facility] OR[facility];  Service: Orthopedics;   Laterality:               Right; 05/09/2019: LAPAROSCOPIC SUPRACERVICAL HYSTERECTOMY; N/A     Comment:  Procedure: LAPAROSCOPIC SUPRACERVICAL HYSTERECTOMY,               Bilateral Salpingectomy;  Surgeon: Schermerhorn, Ihor Austin, MD;  Location: [facility] ORS;  Service: Gynecology;                Laterality: N/A;  BMI    Body Mass Index: 29.74 kg/m      Reproductive/Obstetrics negative OB ROS                             Anesthesia Physical Anesthesia Plan  ASA: 2  Anesthesia Plan: General   Post-op Pain Management:    Induction: Intravenous  PONV Risk Score and Plan: Propofol infusion and TIVA  Airway Management Planned: Natural Airway and Nasal Cannula  Additional Equipment:   Intra-op Plan:   Post-operative Plan:   Informed Consent: I have reviewed the patients History and Physical, chart, labs and discussed the procedure including the risks, benefits and alternatives for the  proposed anesthesia with the patient or authorized representative who has indicated his/her understanding and acceptance.     Dental Advisory Given  Plan Discussed with: Anesthesiologist, CRNA and Surgeon  Anesthesia Plan Comments: (Patient consented for risks of anesthesia including but not limited to:  - adverse reactions to medications - risk of airway placement if required - damage to eyes, teeth, lips or other oral mucosa - nerve damage due to positioning  - sore throat or hoarseness - Damage to heart, brain, nerves, lungs, other parts of body or loss of life  Patient voiced understanding.)       Anesthesia Quick Evaluation

## 2023-03-01 NOTE — Transfer of Care (Signed)
Immediate Anesthesia Transfer of Care Note  Patient: Erica Wells  Procedure(s) Performed: ESOPHAGOGASTRODUODENOSCOPY (EGD) WITH PROPOFOL COLONOSCOPY WITH PROPOFOL  Patient Location: Endoscopy Unit  Anesthesia Type:General  Level of Consciousness: drowsy  Airway & Oxygen Therapy: Patient Spontanous Breathing  Post-op Assessment: Report given to RN  Post vital signs: stable  Last Vitals:  Vitals Value Taken Time  BP    Temp    Pulse 77 03/01/23 1017  Resp 17 03/01/23 1017  SpO2 99 % 03/01/23 1017  Vitals shown include unvalidated device data.  Last Pain:  Vitals:   03/01/23 0927  TempSrc: Temporal         Complications: No notable events documented.

## 2023-03-01 NOTE — Op Note (Signed)
[facility] Gastroenterology Patient Name: Renica Crail Procedure Date: 03/01/2023 9:49 AM MRN: [id number] Account #: [id number] Date of Birth: 1981-06-21 Admit Type: Outpatient Age: 42 Room: [facility] ENDO ROOM 3 Gender: Female Note Status: Finalized Instrument Name: Laurette Schimke [id number] Procedure:             Upper GI endoscopy Indications:           Heartburn, Suspected esophageal reflux Providers:             [name] MD, MD Referring MD:          Mort Sawyers (Referring MD) Medicines:             Monitored Anesthesia Care Complications:         No immediate complications. Procedure:             Pre-Anesthesia Assessment:                        - Prior to the procedure, a History and Physical was                         performed, and patient medications, allergies and                         sensitivities were reviewed. The patient's tolerance                         of previous anesthesia was reviewed.                        - The risks and benefits of the procedure and the                         sedation options and risks were discussed with the                         patient. All questions were answered and informed                         consent was obtained.                        - ASA Grade Assessment: II - A patient with mild                         systemic disease.                        After obtaining informed consent, the endoscope was                         passed under direct vision. Throughout the procedure,                         the patient's blood pressure, pulse, and oxygen                         saturations were monitored continuously. The Endoscope                         was introduced  through the mouth, and advanced to the                         third part of duodenum. The upper GI endoscopy was                         accomplished with ease. The patient tolerated the                         procedure well. Findings:       The examined duodenum was normal.      The esophagus was normal.      The cardia and gastric fundus were normal on retroflexion.      The examined esophagus was normal. Hiataus : L0, F+,D1 Impression:            - Normal examined duodenum.                        - Normal esophagus.                        - Normal esophagus.                        - No specimens collected. Recommendation:        - Perform a colonoscopy today. Procedure Code(s):     --- Professional ---                        [phone], Esophagogastroduodenoscopy, flexible,                         transoral; diagnostic, including collection of                         specimen(s) by brushing or washing, when performed                         (separate procedure) Diagnosis Code(s):     --- Professional ---                        R12, Heartburn CPT copyright 2022 American Medical Association. All rights reserved. The codes documented in this report are preliminary and upon coder review may  be revised to meet current compliance requirements. [name], MD [name] MD, MD 03/01/2023 10:03:17 AM This report has been signed electronically. Number of Addenda: 0 Note Initiated On: 03/01/2023 9:49 AM Estimated Blood Loss:  Estimated blood loss: none.      [facility]

## 2023-03-01 NOTE — Op Note (Signed)
[facility] Gastroenterology Patient Name: Erica Wells Procedure Date: 03/01/2023 9:48 AM MRN: [id number] Account #: [id number] Date of Birth: January 18, 1981 Admit Type: Outpatient Age: 42 Room: [facility] ENDO ROOM 3 Gender: Female Note Status: Finalized Instrument Name: Prentice Docker [id number] Procedure:             Colonoscopy Indications:           Colon cancer screening in patient at increased risk:                         Family history of 1st-degree relative with colon polyps Providers:             [name] MD, MD Referring MD:          Mort Sawyers (Referring MD) Medicines:             Monitored Anesthesia Care Complications:         No immediate complications. Procedure:             Pre-Anesthesia Assessment:                        - Prior to the procedure, a History and Physical was                         performed, and patient medications, allergies and                         sensitivities were reviewed. The patient's tolerance                         of previous anesthesia was reviewed.                        - The risks and benefits of the procedure and the                         sedation options and risks were discussed with the                         patient. All questions were answered and informed                         consent was obtained.                        - ASA Grade Assessment: II - A patient with mild                         systemic disease.                        After obtaining informed consent, the colonoscope was                         passed under direct vision. Throughout the procedure,                         the patient's blood pressure, pulse, and oxygen  saturations were monitored continuously. The                         Colonoscope was introduced through the anus and                         advanced to the the cecum, identified by the                         appendiceal orifice. The colonoscopy was  performed                         with ease. The patient tolerated the procedure well.                         The quality of the bowel preparation was excellent.                         The ileocecal valve, appendiceal orifice, and rectum                         were photographed. Findings:      The perianal and digital rectal examinations were normal.      The entire examined colon appeared normal on direct and retroflexion       views. Impression:            - The entire examined colon is normal on direct and                         retroflexion views.                        - No specimens collected. Recommendation:        - Discharge patient to home (with escort).                        - Resume previous diet.                        - Continue present medications.                        - Repeat colonoscopy in 5 years for screening purposes. Procedure Code(s):     --- Professional ---                        [phone], Colonoscopy, flexible; diagnostic, including                         collection of specimen(s) by brushing or washing, when                         performed (separate procedure) Diagnosis Code(s):     --- Professional ---                        Z83.71, Family history of colonic polyps CPT copyright 2022 American Medical Association. All rights reserved. The codes documented in this report are preliminary and upon coder review may  be revised to meet current compliance requirements. [name], MD Sharlet Salina  Tobi Bastos MD, MD 03/01/2023 10:16:27 AM This report has been signed electronically. Number of Addenda: 0 Note Initiated On: 03/01/2023 9:48 AM Scope Withdrawal Time: 0 hours 8 minutes 24 seconds  Total Procedure Duration: 0 hours 10 minutes 15 seconds  Estimated Blood Loss:  Estimated blood loss: none.      [facility]

## 2023-03-01 NOTE — H&P (Signed)
[name], MD [address], [address], [place], [place], [address] [address], [place], [place], [address] Phone: [phone]  Fax: [phone]  Primary Care Physician:  Mort Sawyers, FNP   Pre-Procedure History & Physical: HPI:  Erica Wells is a 42 y.o. female is here for an endoscopy and colonoscopy    Past Medical History:  Diagnosis Date   Anemia    YEARS AGO   Anxiety    ASCUS of cervix with negative high risk HPV 06/27/2016   Formatting of this note might be different from the original. Negative HPV 2015 and 2016 Normal Pap 2016 S/p hysterectomy 2020   Complication of anesthesia    GERD (gastroesophageal reflux disease)    PONV (postoperative nausea and vomiting)     Past Surgical History:  Procedure Laterality Date   ABDOMINAL HYSTERECTOMY     CHOLECYSTECTOMY  2014   DILATATION & CURETTAGE/HYSTEROSCOPY WITH MYOSURE N/A 09/06/2018   Procedure: DILATATION & CURETTAGE/HYSTEROSCOPY WITH MYOSURE-resection of polyps,submucosal polyps;  Surgeon: Suzy Bouchard, MD;  Location: [facility] ORS;  Service: Gynecology;  Laterality: N/A;   HIP ARTHROSCOPY Right 12/03/2017   Procedure: ARTHROSCOPY HIP;  Surgeon: Signa Kell, MD;  Location: [facility] ORS;  Service: Orthopedics;  Laterality: Right;   LAPAROSCOPIC SUPRACERVICAL HYSTERECTOMY N/A 05/09/2019   Procedure: LAPAROSCOPIC SUPRACERVICAL HYSTERECTOMY, Bilateral Salpingectomy;  Surgeon: Schermerhorn, Ihor Austin, MD;  Location: [facility] ORS;  Service: Gynecology;  Laterality: N/A;    Prior to Admission medications   Medication Sig Start Date End Date Taking? Authorizing Provider  amitriptyline (ELAVIL) 10 MG tablet TAKE 3 TABLETS BY MOUTH AT  BEDTIME 05/31/22   Gweneth Dimitri, MD  amphetamine-dextroamphetamine (ADDERALL XR) 20 MG 24 hr capsule Take 1 capsule (20 mg total) by mouth every morning. 02/19/23   Mort Sawyers, FNP  amphetamine-dextroamphetamine (ADDERALL) 10 MG tablet Take one po qd prn around 1-2 pm  02/19/23   Mort Sawyers, FNP  Azelastine-Fluticasone 137-50 MCG/ACT SUSP Place 1 spray into the nose every 12 (twelve) hours. 02/09/23   Mort Sawyers, FNP  cholestyramine Lanetta Inch) 4 GM/DOSE powder Take 1 packet (4 g total) by mouth 2 (two) times daily with a meal. 02/12/23   [name], MD  clonazePAM Scarlette Calico) 1 MG tablet Take by mouth. 12/08/18   [provider]  Drospirenone (SLYND) 4 MG TABS Take 1 tablet (4 mg total) by mouth daily. 02/22/23   [place]akaibito Bing, MD  famotidine (PEPCID) 40 MG tablet Take 1 tablet (40 mg total) by mouth daily. 02/12/23   [name], MD  naproxen sodium (ALEVE) 220 MG tablet Take 220 mg by mouth daily as needed.    [provider]  rizatriptan (MAXALT) 10 MG tablet Take 1 tablet (10 mg total) by mouth as needed for migraine. May repeat in 2 hours if needed 07/10/22   Gweneth Dimitri, MD  sertraline (ZOLOFT) 100 MG tablet TAKE 1 TABLET BY MOUTH AT  BEDTIME 01/29/23   Mort Sawyers, FNP  topiramate (TOPAMAX) 50 MG tablet Take 1 tablet (50 mg total) by mouth 2 (two) times daily. 02/09/23   Mort Sawyers, FNP    Allergies as of 02/12/2023 - Review Complete 02/12/2023  Allergen Reaction Noted   Penicillins Anaphylaxis 11/14/2017    Family History  Problem Relation Age of Onset   Arthritis Mother    Colon polyps Mother    Parkinson's disease Mother    Depression Father    Diabetes Father    Heart disease Father  Non-Hodgkin's lymphoma Father    Alcohol abuse Paternal Grandmother    Heart disease Paternal Grandmother    Heart disease Maternal Grandmother    Colon cancer Maternal Grandfather 100   Diabetes Paternal Grandfather    Breast cancer Maternal Aunt 70    Social History   Socioeconomic History   Marital status: Married    Spouse name: Maisie Fus   Number of children: 0   Years of education: Bachelors degree   Highest education level: Not on file  Occupational History    Employer: [employer]  Tobacco Use   Smoking status: Never    Smokeless tobacco: Never  Vaping Use   Vaping Use: Never used  Substance and Sexual Activity   Alcohol use: Yes    Alcohol/week: 3.0 standard drinks of alcohol    Types: 3 Cans of beer per week    Comment: 4 times a week, 1-2 beers   Drug use: No   Sexual activity: Yes    Birth control/protection: Surgical    Comment: one partner  Other Topics Concern   Not on file  Social History Narrative   03/31/20   From: [place], came for college   Living: with husband, Maisie Fus    Work: Chief Operating Officer - [employer]      Family: good relationship with family - most of her family is in [place]      Enjoys: hiking, pets (25 chickens - gives away eggs, 3 ducks)      Exercise: hiking 4 times a week 3-4 mile hike   Diet: weight watchers diet - using the app       Safety   Seat belts: Yes    Guns: Yes  and secure   Safe in relationships: Yes    Social Determinants of Corporate investment banker Strain: Not on file  Food Insecurity: Not on file  Transportation Needs: Not on file  Physical Activity: Not on file  Stress: Not on file  Social Connections: Not on file  Intimate Partner Violence: Not on file    Review of Systems: See HPI, otherwise negative ROS  Physical Exam: LMP 04/16/2019  General:   Alert,  pleasant and cooperative in NAD Head:  Normocephalic and atraumatic. Neck:  Supple; no masses or thyromegaly. Lungs:  Clear throughout to auscultation, normal respiratory effort.    Heart:  +S1, +S2, Regular rate and rhythm, No edema. Abdomen:  Soft, nontender and nondistended. Normal bowel sounds, without guarding, and without rebound.   Neurologic:  Alert and  oriented x4;  grossly normal neurologically.  Impression/Plan: Erica Wells is here for an endoscopy and colonoscopy  to be performed for  evaluation of colon cancer screening and acid rflux    Risks, benefits, limitations, and alternatives regarding endoscopy have been reviewed with the patient.  Questions have  been answered.  All parties agreeable.   [name], MD  03/01/2023, 9:19 AM

## 2023-03-01 NOTE — Anesthesia Postprocedure Evaluation (Signed)
Anesthesia Post Note  Patient: Erica Wells  Procedure(s) Performed: ESOPHAGOGASTRODUODENOSCOPY (EGD) WITH PROPOFOL COLONOSCOPY WITH PROPOFOL  Patient location during evaluation: Endoscopy Anesthesia Type: General Level of consciousness: awake and alert Pain management: pain level controlled Vital Signs Assessment: post-procedure vital signs reviewed and stable Respiratory status: spontaneous breathing, nonlabored ventilation, respiratory function stable and patient connected to nasal cannula oxygen Cardiovascular status: blood pressure returned to baseline and stable Postop Assessment: no apparent nausea or vomiting Anesthetic complications: no   No notable events documented.   Last Vitals:  Vitals:   03/01/23 1017 03/01/23 1037  BP: 110/72 112/76  Pulse: 76   Resp: 17   Temp:  (!) 36.1 C  SpO2: 99%     Last Pain:  Vitals:   03/01/23 1037  TempSrc: Temporal                 Cleda Mccreedy Ziasia Lenoir

## 2023-03-02 ENCOUNTER — Encounter: Payer: Self-pay | Admitting: Gastroenterology

## 2023-04-07 ENCOUNTER — Encounter: Payer: Self-pay | Admitting: Family

## 2023-04-07 DIAGNOSIS — F902 Attention-deficit hyperactivity disorder, combined type: Secondary | ICD-10-CM

## 2023-04-09 MED ORDER — AMPHETAMINE-DEXTROAMPHET ER 20 MG PO CP24
20.0000 mg | ORAL_CAPSULE | ORAL | 0 refills | Status: DC
Start: 2023-04-09 — End: 2023-05-17

## 2023-04-09 MED ORDER — AMPHETAMINE-DEXTROAMPHETAMINE 10 MG PO TABS
ORAL_TABLET | ORAL | 0 refills | Status: DC
Start: 2023-04-09 — End: 2023-05-17

## 2023-04-09 MED ORDER — AMPHETAMINE-DEXTROAMPHET ER 20 MG PO CP24
20.0000 mg | ORAL_CAPSULE | ORAL | 0 refills | Status: DC
Start: 2023-05-09 — End: 2023-05-17

## 2023-04-09 MED ORDER — AMPHETAMINE-DEXTROAMPHETAMINE 10 MG PO TABS
ORAL_TABLET | ORAL | 0 refills | Status: DC
Start: 2023-05-09 — End: 2023-05-17

## 2023-05-10 ENCOUNTER — Other Ambulatory Visit: Payer: Self-pay

## 2023-05-10 MED ORDER — AMITRIPTYLINE HCL 10 MG PO TABS: 30.0000 mg | ORAL_TABLET | Freq: Every day | ORAL | 1 refills | Status: DC

## 2023-05-14 ENCOUNTER — Other Ambulatory Visit: Payer: Self-pay | Admitting: Obstetrics and Gynecology

## 2023-05-15 ENCOUNTER — Encounter: Payer: Self-pay | Admitting: Family

## 2023-05-15 DIAGNOSIS — F902 Attention-deficit hyperactivity disorder, combined type: Secondary | ICD-10-CM

## 2023-05-15 NOTE — Telephone Encounter (Signed)
[facility] contacted [place], advised me of the same thing as patient stated below. Says patient has been out for almost 2 weeks. Asked if there is an alternative that could be sent in for patient to take. Please advise patient if needed, thanks

## 2023-05-17 MED ORDER — AMPHETAMINE-DEXTROAMPHETAMINE 10 MG PO TABS: ORAL_TABLET | ORAL | 0 refills | Status: DC

## 2023-05-17 MED ORDER — AMPHETAMINE-DEXTROAMPHET ER 20 MG PO CP24
20.0000 mg | ORAL_CAPSULE | ORAL | 0 refills | Status: DC
Start: 2023-05-17 — End: 2023-05-21

## 2023-05-17 NOTE — Addendum Note (Signed)
Addended by: Mort Sawyers on: 05/17/2023 10:32 AM   Modules accepted: Orders

## 2023-05-21 ENCOUNTER — Ambulatory Visit: Payer: Managed Care, Other (non HMO) | Admitting: Family

## 2023-05-21 ENCOUNTER — Encounter: Payer: Self-pay | Admitting: Family

## 2023-05-21 VITALS — BP 113/24 | HR 110 | Temp 98.6°F | Ht 69.0 in | Wt 208.0 lb

## 2023-05-21 DIAGNOSIS — K219 Gastro-esophageal reflux disease without esophagitis: Secondary | ICD-10-CM

## 2023-05-21 DIAGNOSIS — H9201 Otalgia, right ear: Secondary | ICD-10-CM | POA: Diagnosis not present

## 2023-05-21 DIAGNOSIS — E6609 Other obesity due to excess calories: Secondary | ICD-10-CM | POA: Diagnosis not present

## 2023-05-21 DIAGNOSIS — Z6831 Body mass index (BMI) 31.0-31.9, adult: Secondary | ICD-10-CM

## 2023-05-21 DIAGNOSIS — F902 Attention-deficit hyperactivity disorder, combined type: Secondary | ICD-10-CM | POA: Diagnosis not present

## 2023-05-21 MED ORDER — AMPHETAMINE-DEXTROAMPHET ER 20 MG PO CP24
20.0000 mg | ORAL_CAPSULE | ORAL | 0 refills | Status: DC
Start: 2023-08-21 — End: 2023-12-03

## 2023-05-21 MED ORDER — AMPHETAMINE-DEXTROAMPHETAMINE 10 MG PO TABS
ORAL_TABLET | ORAL | 0 refills | Status: DC
Start: 2023-07-21 — End: 2023-12-03

## 2023-05-21 MED ORDER — AMPHETAMINE-DEXTROAMPHETAMINE 10 MG PO TABS
ORAL_TABLET | ORAL | 0 refills | Status: DC
Start: 2023-06-21 — End: 2023-12-03

## 2023-05-21 MED ORDER — AMPHETAMINE-DEXTROAMPHET ER 20 MG PO CP24
20.0000 mg | ORAL_CAPSULE | ORAL | 0 refills | Status: DC
Start: 2023-06-21 — End: 2023-12-03

## 2023-05-21 MED ORDER — AMPHETAMINE-DEXTROAMPHET ER 20 MG PO CP24
20.0000 mg | ORAL_CAPSULE | ORAL | 0 refills | Status: DC
Start: 2023-07-21 — End: 2023-12-03

## 2023-05-21 MED ORDER — AMPHETAMINE-DEXTROAMPHETAMINE 10 MG PO TABS
ORAL_TABLET | ORAL | 0 refills | Status: DC
Start: 2023-08-21 — End: 2023-12-03

## 2023-05-21 MED ORDER — PREDNISONE 10 MG (21) PO TBPK
ORAL_TABLET | ORAL | 0 refills | Status: DC
Start: 2023-05-21 — End: 2023-12-03

## 2023-05-21 NOTE — Assessment & Plan Note (Signed)
Tried to d/c pantoprazole but was so uncomfortable had to restart.

## 2023-05-21 NOTE — Progress Notes (Signed)
Established Patient Office Visit  Subjective:      CC:  Chief Complaint  Patient presents with   Medical Management of Chronic Issues    HPI: Erica Wells is a 42 y.o. female presenting on 05/21/2023 for Medical Management of Chronic Issues . ADHD: on adderall 20 mg XR and adderall 10 mg IR.  Doing well with this no cp palp and or sob.   New complaints: Right ear fullness x one month. Hears constant crackling in her ear almost like 'white noise'. Had covid a few weeks ago but had had this sensation from prior to covid. Still using azelastine fluticasone daily.   Had recent lipid panel and A1c drawn at work. With her today. LDL 119. A1c 5.3.    Social history:  Relevant past medical, surgical, family and social history reviewed and updated as indicated. Interim medical history since our last visit reviewed.  Allergies and medications reviewed and updated.  DATA REVIEWED: CHART IN EPIC     ROS: Negative unless specifically indicated above in HPI.    Current Outpatient Medications:    amitriptyline (ELAVIL) 10 MG tablet, Take 3 tablets (30 mg total) by mouth at bedtime., Disp: 270 tablet, Rfl: 1   Azelastine-Fluticasone 137-50 MCG/ACT SUSP, Place 1 spray into the nose every 12 (twelve) hours., Disp: 23 g, Rfl: 5   cholestyramine (QUESTRAN) 4 GM/DOSE powder, Take 1 packet (4 g total) by mouth 2 (two) times daily with a meal., Disp: 240 g, Rfl: 11   clonazePAM (KLONOPIN) 1 MG tablet, Take by mouth., Disp: , Rfl:    Drospirenone (SLYND) 4 MG TABS, Take 1 tablet (4 mg total) by mouth daily., Disp: 90 tablet, Rfl: 1   naproxen sodium (ALEVE) 220 MG tablet, Take 220 mg by mouth daily as needed., Disp: , Rfl:    pantoprazole (PROTONIX) 20 MG tablet, Take 20 mg by mouth daily., Disp: , Rfl:    predniSONE (STERAPRED UNI-PAK 21 TAB) 10 MG (21) TBPK tablet, Take as directed, Disp: 1 each, Rfl: 0   rizatriptan (MAXALT) 10 MG tablet, Take 1 tablet (10 mg total) by mouth as needed  for migraine. May repeat in 2 hours if needed, Disp: 10 tablet, Rfl: 0   sertraline (ZOLOFT) 100 MG tablet, TAKE 1 TABLET BY MOUTH AT  BEDTIME, Disp: 90 tablet, Rfl: 3   topiramate (TOPAMAX) 50 MG tablet, Take 1 tablet (50 mg total) by mouth 2 (two) times daily., Disp: 180 tablet, Rfl: 1   [START ON 06/21/2023] amphetamine-dextroamphetamine (ADDERALL XR) 20 MG 24 hr capsule, Take 1 capsule (20 mg total) by mouth every morning., Disp: 30 capsule, Rfl: 0   [START ON 07/21/2023] amphetamine-dextroamphetamine (ADDERALL XR) 20 MG 24 hr capsule, Take 1 capsule (20 mg total) by mouth every morning., Disp: 30 capsule, Rfl: 0   [START ON 08/21/2023] amphetamine-dextroamphetamine (ADDERALL XR) 20 MG 24 hr capsule, Take 1 capsule (20 mg total) by mouth every morning., Disp: 30 capsule, Rfl: 0   [START ON 07/21/2023] amphetamine-dextroamphetamine (ADDERALL) 10 MG tablet, Take one po qd prn around 1-2 pm, Disp: 30 tablet, Rfl: 0   [START ON 06/21/2023] amphetamine-dextroamphetamine (ADDERALL) 10 MG tablet, Take one po qd prn around 1-2 pm, Disp: 30 tablet, Rfl: 0   [START ON 08/21/2023] amphetamine-dextroamphetamine (ADDERALL) 10 MG tablet, Take one po qd prn around 1-2 pm, Disp: 30 tablet, Rfl: 0      Objective:    BP (!) 113/24   Pulse (!) 110   Temp 98.6  F (37 C) (Temporal)   Ht 5\' 9"  (1.753 m)   Wt 208 lb (94.3 kg)   LMP 04/16/2019   SpO2 99%   BMI 30.72 kg/m   Wt Readings from Last 3 Encounters:  05/21/23 208 lb (94.3 kg)  03/01/23 201 lb 6.4 oz (91.4 kg)  02/19/23 208 lb 9.6 oz (94.6 kg)    Physical Exam Constitutional:      General: She is not in acute distress.    Appearance: Normal appearance. She is normal weight. She is not ill-appearing, toxic-appearing or diaphoretic.  HENT:     Head: Normocephalic.     Right Ear: A middle ear effusion (clear) is present.     Left Ear: Tympanic membrane normal.     Nose: Nose normal.     Mouth/Throat:     Mouth: Mucous membranes are dry.      Pharynx: No oropharyngeal exudate or posterior oropharyngeal erythema.  Eyes:     Extraocular Movements: Extraocular movements intact.     Pupils: Pupils are equal, round, and reactive to light.  Cardiovascular:     Rate and Rhythm: Normal rate and regular rhythm.     Pulses: Normal pulses.     Heart sounds: Normal heart sounds.  Pulmonary:     Effort: Pulmonary effort is normal.     Breath sounds: Normal breath sounds.  Musculoskeletal:     Cervical back: Normal range of motion.  Neurological:     General: No focal deficit present.     Mental Status: She is alert and oriented to person, place, and time. Mental status is at baseline.  Psychiatric:        Mood and Affect: Mood normal.        Behavior: Behavior normal.        Thought Content: Thought content normal.        Judgment: Judgment normal.           Assessment & Plan:  ADHD (attention deficit hyperactivity disorder), combined type Assessment & Plan: Refill adderall 20 mg XR once daily  Add on adderall 10 mg IR once daily around 1-2 pm  Pdmp reviewed.  Orders: -     Amphetamine-Dextroamphetamine; Take one po qd prn around 1-2 pm  Dispense: 30 tablet; Refill: 0 -     Amphetamine-Dextroamphet ER; Take 1 capsule (20 mg total) by mouth every morning.  Dispense: 30 capsule; Refill: 0 -     Amphetamine-Dextroamphet ER; Take 1 capsule (20 mg total) by mouth every morning.  Dispense: 30 capsule; Refill: 0 -     Amphetamine-Dextroamphetamine; Take one po qd prn around 1-2 pm  Dispense: 30 tablet; Refill: 0 -     Amphetamine-Dextroamphetamine; Take one po qd prn around 1-2 pm  Dispense: 30 tablet; Refill: 0 -     Amphetamine-Dextroamphet ER; Take 1 capsule (20 mg total) by mouth every morning.  Dispense: 30 capsule; Refill: 0  Otalgia of right ear Assessment & Plan: Possible allergic in nature  Prednisone pack rx sent to pharmacy  If no improvement consider ent for possible tinitis  Orders: -     predniSONE; Take as  directed  Dispense: 1 each; Refill: 0  Gastroesophageal reflux disease, unspecified whether esophagitis present Assessment & Plan: Tried to d/c pantoprazole but was so uncomfortable had to restart.     Class 1 obesity due to excess calories without serious comorbidity with body mass index (BMI) of 31.0 to 31.9 in adult Assessment & Plan: Pt advised to  work on diet and exercise as tolerated       Return in about 3 months (around 08/21/2023).  Mort Sawyers, MSN, APRN, FNP-C [facility]

## 2023-05-21 NOTE — Assessment & Plan Note (Signed)
Pt advised to work on diet and exercise as tolerated  

## 2023-05-21 NOTE — Assessment & Plan Note (Signed)
Refill adderall 20 mg XR once daily  Add on adderall 10 mg IR once daily around 1-2 pm  Pdmp reviewed.

## 2023-05-21 NOTE — Assessment & Plan Note (Signed)
Possible allergic in nature  Prednisone pack rx sent to pharmacy  If no improvement consider ent for possible tinitis

## 2023-06-11 ENCOUNTER — Ambulatory Visit: Payer: Managed Care, Other (non HMO) | Admitting: Gastroenterology

## 2023-06-11 VITALS — BP 134/86 | HR 102 | Temp 98.1°F | Ht 69.0 in | Wt 204.1 lb

## 2023-06-11 DIAGNOSIS — K9089 Other intestinal malabsorption: Secondary | ICD-10-CM

## 2023-06-11 DIAGNOSIS — K219 Gastro-esophageal reflux disease without esophagitis: Secondary | ICD-10-CM

## 2023-06-11 NOTE — Progress Notes (Signed)
Erica Mood MD, MRCP(U.K) [address]  [address]  [place], [place] [address]  Main: [phone]  Fax: [phone]   Primary Care Physician: Mort Sawyers, FNP  Primary Gastroenterologist:  Dr. Wyline Wells   Chief Complaint  Patient presents with   Follow-up    HPI: Erica Wells is a 42 y.o. female   Summary of history :  Initially referred and seen back in 01/2023 for IBS/GERD. She has a longstanding history of IBS flipping between constipation and diarrhea but she has been having more diarrhea since she had a gallbladder surgery for a few years no recent change in her symptoms she had been taking colestipol for her diarrhea but it either makes her too constipated when she takes a full dose or when she avoids the medication gets her diarrhea. She has never tried Runner, broadcasting/film/video. No other change in her bowel symptoms. Her mother had colon polyps in her 60s. She also has a longstanding history of reflux she has been on a PPI such as pantoprazole and Prilosec for over 9 years. She takes 40 mg at nighttime before dinner and she works night shifts. If she misses a dose she gets significant symptoms. She has not gained or lost weight recently. No family history of esophageal cancer.   Interval history   02/12/2023-06/11/2023   03/01/2023: Colonoscopy : Normal . EGD: Normal.   She is doing well since her last visit.  Still taking colestipol which has controlled her diarrhea no other issues.  She has tried [place] but she did not like it.  She tried to go off PPI and stay on famotidine but her symptoms were significant hence went back to Prilosec.  Taking it once a day.   Current Outpatient Medications  Medication Sig Dispense Refill   amitriptyline (ELAVIL) 10 MG tablet Take 3 tablets (30 mg total) by mouth at bedtime. 270 tablet 1   [START ON 06/21/2023] amphetamine-dextroamphetamine (ADDERALL XR) 20 MG 24 hr capsule Take 1 capsule (20 mg total) by mouth every morning. 30 capsule 0    [START ON 07/21/2023] amphetamine-dextroamphetamine (ADDERALL XR) 20 MG 24 hr capsule Take 1 capsule (20 mg total) by mouth every morning. 30 capsule 0   [START ON 08/21/2023] amphetamine-dextroamphetamine (ADDERALL XR) 20 MG 24 hr capsule Take 1 capsule (20 mg total) by mouth every morning. 30 capsule 0   [START ON 07/21/2023] amphetamine-dextroamphetamine (ADDERALL) 10 MG tablet Take one po qd prn around 1-2 pm 30 tablet 0   [START ON 06/21/2023] amphetamine-dextroamphetamine (ADDERALL) 10 MG tablet Take one po qd prn around 1-2 pm 30 tablet 0   [START ON 08/21/2023] amphetamine-dextroamphetamine (ADDERALL) 10 MG tablet Take one po qd prn around 1-2 pm 30 tablet 0   Azelastine-Fluticasone 137-50 MCG/ACT SUSP Place 1 spray into the nose every 12 (twelve) hours. 23 g 5   cholestyramine (QUESTRAN) 4 GM/DOSE powder Take 1 packet (4 g total) by mouth 2 (two) times daily with a meal. 240 g 11   clonazePAM (KLONOPIN) 1 MG tablet Take by mouth.     Drospirenone (SLYND) 4 MG TABS Take 1 tablet (4 mg total) by mouth daily. 90 tablet 1   naproxen sodium (ALEVE) 220 MG tablet Take 220 mg by mouth daily as needed.     pantoprazole (PROTONIX) 20 MG tablet Take 20 mg by mouth daily.     predniSONE (STERAPRED UNI-PAK 21 TAB) 10 MG (21) TBPK tablet Take as directed 1 each 0   rizatriptan (MAXALT) 10  MG tablet Take 1 tablet (10 mg total) by mouth as needed for migraine. May repeat in 2 hours if needed 10 tablet 0   sertraline (ZOLOFT) 100 MG tablet TAKE 1 TABLET BY MOUTH AT  BEDTIME 90 tablet 3   topiramate (TOPAMAX) 50 MG tablet Take 1 tablet (50 mg total) by mouth 2 (two) times daily. 180 tablet 1   No current facility-administered medications for this visit.    Allergies as of 06/11/2023 - Review Complete 06/11/2023  Allergen Reaction Noted   Penicillins Anaphylaxis 11/14/2017      ROS:  General: Negative for anorexia, weight loss, fever, chills, fatigue, weakness. ENT: Negative for hoarseness, difficulty  swallowing , nasal congestion. CV: Negative for chest pain, angina, palpitations, dyspnea on exertion, peripheral edema.  Respiratory: Negative for dyspnea at rest, dyspnea on exertion, cough, sputum, wheezing.  GI: See history of present illness. GU:  Negative for dysuria, hematuria, urinary incontinence, urinary frequency, nocturnal urination.  Endo: Negative for unusual weight change.    Physical Examination:   BP 134/86   Pulse (!) 102   Temp 98.1 F (36.7 C) (Oral)   Ht 5\' 9"  (1.753 m)   Wt 204 lb 2 oz (92.6 kg)   LMP 04/16/2019   BMI 30.14 kg/m   General: Well-nourished, well-developed in no acute distress.  Eyes: No icterus. Conjunctivae pink. Neuro: Alert and oriented x 3.  Grossly intact. Skin: Warm and dry, no jaundice.   Psych: Alert and cooperative, normal Wells and affect.   Imaging Studies: No results found.  Assessment and Plan:   Erica Wells is a 42 y.o. 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  She has longstanding acid reflux unable to wean off PPI intolerant to famotidine.  Hence needs to stay on PPI discussed the long-term effects of PPI use and options to treat reflux with surgery and other similar procedures.  She is not interested at this point of time but would like to discuss further in 1 years time  Discussed the risks and benefits of long term PPI use including but not limited to bone loss, chronic kidney disease, infections , low magnesium . Aim to use at the lowest dose for the shortest period of time   Dr Erica Mood  MD,MRCP [facility]) Follow up in 1 year

## 2023-06-30 ENCOUNTER — Encounter: Payer: Self-pay | Admitting: Family

## 2023-08-13 ENCOUNTER — Encounter: Payer: Self-pay | Admitting: Obstetrics and Gynecology

## 2023-08-13 ENCOUNTER — Other Ambulatory Visit: Payer: Self-pay | Admitting: *Deleted

## 2023-08-13 MED ORDER — SLYND 4 MG PO TABS: 1.0000 | ORAL_TABLET | Freq: Every day | ORAL | 1 refills | Status: DC

## 2023-08-25 ENCOUNTER — Other Ambulatory Visit: Payer: Self-pay | Admitting: Family

## 2023-09-21 ENCOUNTER — Encounter: Payer: Self-pay | Admitting: Obstetrics and Gynecology

## 2023-09-21 ENCOUNTER — Ambulatory Visit (INDEPENDENT_AMBULATORY_CARE_PROVIDER_SITE_OTHER): Payer: Managed Care, Other (non HMO) | Admitting: Obstetrics and Gynecology

## 2023-09-21 VITALS — BP 132/79 | HR 102 | Ht 69.0 in | Wt 200.0 lb

## 2023-09-21 DIAGNOSIS — Z1239 Encounter for other screening for malignant neoplasm of breast: Secondary | ICD-10-CM

## 2023-09-21 DIAGNOSIS — Z01419 Encounter for gynecological examination (general) (routine) without abnormal findings: Secondary | ICD-10-CM | POA: Diagnosis not present

## 2023-09-21 DIAGNOSIS — Z90711 Acquired absence of uterus with remaining cervical stump: Secondary | ICD-10-CM | POA: Diagnosis not present

## 2023-09-21 DIAGNOSIS — Z124 Encounter for screening for malignant neoplasm of cervix: Secondary | ICD-10-CM

## 2023-09-21 DIAGNOSIS — Z1339 Encounter for screening examination for other mental health and behavioral disorders: Secondary | ICD-10-CM | POA: Diagnosis not present

## 2023-09-21 DIAGNOSIS — N809 Endometriosis, unspecified: Secondary | ICD-10-CM

## 2023-09-21 MED ORDER — SLYND 4 MG PO TABS: 1.0000 | ORAL_TABLET | Freq: Every day | ORAL | 0 refills | Status: AC

## 2023-09-21 NOTE — Progress Notes (Signed)
Obstetrics and Gynecology New Patient Evaluation  Appointment Date: 09/21/2023  OBGYN Clinic: [facility] for [facility]  Primary Care Provider: Mort Sawyers  Chief Complaint:  Chief Complaint  Patient presents with   Gynecologic Exam    History of Present Illness: Erica Wells is a 42 y.o.  G0P0000 (Patient's last menstrual period was 04/16/2019.), seen for the above chief complaint. Her past medical history is significant for h/o 2020 l/s [facility] with biopsy confirming endo, h/o migraines  No issues or problems. Patient had previously been off medications for symptom control and had recurrence.  Slynd working well and thinks Vinnie Langton is also helping her migraines.    Review of Systems: Pertinent items are noted in HPI.   Patient Active Problem List   Diagnosis Date Noted   History of hysterectomy, supracervical 09/21/2023   Family history of colon polyps, unspecified 03/01/2023   Class 1 obesity due to excess calories without serious comorbidity with body mass index (BMI) of 31.0 to 31.9 in adult 11/23/2022   Irritable bowel syndrome with both constipation and diarrhea 10/23/2022   Endometriosis determined by laparoscopy 11/28/2021   Migraine 03/31/2020   Acne vulgaris 03/31/2020   ADHD (attention deficit hyperactivity disorder), combined type 07/23/2017   Anxiety, generalized 07/23/2017   Vasovagal syncope 06/09/2014   Non-seasonal allergic rhinitis due to pollen 10/23/2012   Hyperlipidemia 10/23/2012   GERD (gastroesophageal reflux disease) 07/03/2012    Past Medical History:  Past Medical History:  Diagnosis Date   Anemia    YEARS AGO   Anxiety    ASCUS of cervix with negative high risk HPV 06/27/2016   Formatting of this note might be different from the original. Negative HPV 2015 and 2016 Normal Pap 2016 S/p hysterectomy 2020   Complication of anesthesia    GERD (gastroesophageal reflux disease)    PONV (postoperative nausea and vomiting)     Past  Surgical History:  Past Surgical History:  Procedure Laterality Date   ABDOMINAL HYSTERECTOMY     CHOLECYSTECTOMY  2014   COLONOSCOPY WITH PROPOFOL N/A 03/01/2023   Procedure: COLONOSCOPY WITH PROPOFOL;  Surgeon: [name], MD;  Location: [facility] ENDOSCOPY;  Service: Gastroenterology;  Laterality: N/A;   DILATATION & CURETTAGE/HYSTEROSCOPY WITH MYOSURE N/A 09/06/2018   Procedure: DILATATION & CURETTAGE/HYSTEROSCOPY WITH MYOSURE-resection of polyps,submucosal polyps;  Surgeon: Schermerhorn, Ihor Austin, MD;  Location: [facility] ORS;  Service: Gynecology;  Laterality: N/A;   ESOPHAGOGASTRODUODENOSCOPY (EGD) WITH PROPOFOL N/A 03/01/2023   Procedure: ESOPHAGOGASTRODUODENOSCOPY (EGD) WITH PROPOFOL;  Surgeon: [name], MD;  Location[facility] ENDOSCOPY;  Service: Gastroenterology;  Laterality: N/A;   HIP ARTHROSCOPY Right 12/03/2017   Procedure: ARTHROSCOPY HIP;  Surgeon: Signa Kell, MD;  Location: [facility] ORS;  Service: Orthopedics;  Laterality: Right;   LAPAROSCOPIC SUPRACERVICAL HYSTERECTOMY N/A 05/09/2019   Procedure: LAPAROSCOPIC SUPRACERVICAL HYSTERECTOMY, Bilateral Salpingectomy;  Surgeon: Schermerhorn, Ihor Austin, MD;  Location: [facility] ORS;  Service: Gynecology;  Laterality: N/A;    Past Obstetrical History:  OB History  Gravida Para Term Preterm AB Living  0 0 0 0 0 0  SAB IAB Ectopic Multiple Live Births  0 0 0 0 0   Past Gynecological History: As per HPI.  Social History:  Social History   Socioeconomic History   Marital status: Married    Spouse name: Maisie Fus   Number of children: 0   Years of education: Bachelors degree   Highest education level: Not on file  Occupational History    Employer: [employer]  Tobacco Use   Smoking status:  Never   Smokeless tobacco: Never  Vaping Use   Vaping status: Never Used  Substance and Sexual Activity   Alcohol use: Yes    Alcohol/week: 3.0 standard drinks of alcohol    Types: 3 Cans of beer per week    Comment: 4 times a week, 1-2 beers   Drug use:  No   Sexual activity: Yes    Birth control/protection: Surgical    Comment: one partner  Other Topics Concern   Not on file  Social History Narrative   03/31/20   From: [place], came for college   Living: with husband, Maisie Fus    Work: Chief Operating Officer - [employer]      Family: good relationship with family - most of her family is in [place]      Enjoys: hiking, pets (25 chickens - gives away eggs, 3 ducks)      Exercise: hiking 4 times a week 3-4 mile hike   Diet: weight watchers diet - using the app       Safety   Seat belts: Yes    Guns: Yes  and secure   Safe in relationships: Yes    Social Determinants of Corporate investment banker Strain: Not on file  Food Insecurity: Not on file  Transportation Needs: Not on file  Physical Activity: Not on file  Stress: Not on file  Social Connections: Not on file  Intimate Partner Violence: Not on file    Family History:  Family History  Problem Relation Age of Onset   Arthritis Mother    Colon polyps Mother    Parkinson's disease Mother    Depression Father    Diabetes Father    Heart disease Father    Non-Hodgkin's lymphoma Father    Alcohol abuse Paternal Grandmother    Heart disease Paternal Grandmother    Heart disease Maternal Grandmother    Colon cancer Maternal Grandfather 50   Diabetes Paternal Grandfather    Breast cancer Maternal Aunt 55    Medications Stacie E. Freehling had no medications administered during this visit. Current Outpatient Medications  Medication Sig Dispense Refill   amitriptyline (ELAVIL) 10 MG tablet Take 3 tablets (30 mg total) by mouth at bedtime. 270 tablet 1   amphetamine-dextroamphetamine (ADDERALL XR) 20 MG 24 hr capsule Take 1 capsule (20 mg total) by mouth every morning. 30 capsule 0   amphetamine-dextroamphetamine (ADDERALL XR) 20 MG 24 hr capsule Take 1 capsule (20 mg total) by mouth every morning. 30 capsule 0   amphetamine-dextroamphetamine (ADDERALL XR) 20 MG 24 hr capsule Take  1 capsule (20 mg total) by mouth every morning. 30 capsule 0   amphetamine-dextroamphetamine (ADDERALL) 10 MG tablet Take one po qd prn around 1-2 pm 30 tablet 0   amphetamine-dextroamphetamine (ADDERALL) 10 MG tablet Take one po qd prn around 1-2 pm 30 tablet 0   amphetamine-dextroamphetamine (ADDERALL) 10 MG tablet Take one po qd prn around 1-2 pm 30 tablet 0   Azelastine-Fluticasone 137-50 MCG/ACT SUSP Place 1 spray into the nose every 12 (twelve) hours. 23 g 5   cholestyramine (QUESTRAN) 4 GM/DOSE powder Take 1 packet (4 g total) by mouth 2 (two) times daily with a meal. 240 g 11   clonazePAM (KLONOPIN) 1 MG tablet Take by mouth.     naproxen sodium (ALEVE) 220 MG tablet Take 220 mg by mouth daily as needed.     pantoprazole (PROTONIX) 20 MG tablet Take 20 mg by mouth daily.  predniSONE (STERAPRED UNI-PAK 21 TAB) 10 MG (21) TBPK tablet Take as directed 1 each 0   rizatriptan (MAXALT) 10 MG tablet Take 1 tablet (10 mg total) by mouth as needed for migraine. May repeat in 2 hours if needed 10 tablet 0   sertraline (ZOLOFT) 100 MG tablet TAKE 1 TABLET BY MOUTH AT  BEDTIME 90 tablet 3   topiramate (TOPAMAX) 50 MG tablet TAKE 1 TABLET BY MOUTH TWICE  DAILY 180 tablet 1   Drospirenone (SLYND) 4 MG TABS Take 1 tablet (4 mg total) by mouth daily. 365 tablet 0   No current facility-administered medications for this visit.   Allergies Penicillins  Physical Exam:  BP 132/79   Pulse (!) 102   Ht 5\' 9"  (1.753 m)   Wt 200 lb (90.7 kg)   LMP 04/16/2019   BMI 29.53 kg/m  Body mass index is 29.53 kg/m. General appearance: Well nourished, well developed female in no acute distress.  Neck:  Supple, normal appearance, and no thyromegaly  Cardiovascular: normal s1 and s2.  No murmurs, rubs or gallops. Respiratory:  Clear to auscultation bilateral. Normal respiratory effort Abdomen: positive bowel sounds and no masses, hernias; diffusely non tender to palpation, non distended Breasts: breasts  appear normal, no suspicious masses, no skin or nipple changes or axillary nodes, normal palpation. Neuro/Psych:  Normal mood and affect.  Skin:  Warm and dry.  Lymphatic:  No inguinal lymphadenopathy.   Cervical exam performed in the presence of a chaperone Pelvic exam: is not limited by body habitus EGBUS: within normal limits Vagina: within normal limits and with no blood or discharge in the vault Cervix: normal appearing cervix without tenderness, discharge or lesions.  Bimanual negative  Laboratory: none  Radiology: none  Assessment: patient stable  Plan:  1. Endometriosis determined by laparoscopy Continue Slynd for symptom control. 1 year refill sent in   2. History of hysterectomy, supracervical - IGP, Aptima HPV, rfx 16/18,45  3. Cervical cancer screening - IGP, Aptima HPV, rfx 16/18,45  4. Breast screening - MM 3D SCREENING MAMMOGRAM BILATERAL BREAST; Future  5. Well woman exam with routine gynecological exam - IGP, Aptima HPV, rfx 16/18,45 - MM 3D SCREENING MAMMOGRAM BILATERAL BREAST; Future  RTC 1 year.   Cornelia Copa MD Attending Center for [facility])

## 2023-09-21 NOTE — Progress Notes (Signed)
Patient presents for Annual.  [employer] EM[employer]   HYST  Last pap:  >5 yrs per pt. Contraception: OCP Slynd has to be ordered through [employer]  Mammogram: Not yet indicated STD Screening: Declines   CC: Annual/None

## 2023-09-28 LAB — IGP, APTIMA HPV, RFX 16/18,45
HPV Aptima: NEGATIVE
PAP Smear Comment: 0

## 2023-10-11 ENCOUNTER — Other Ambulatory Visit: Payer: Self-pay | Admitting: Family

## 2023-10-11 ENCOUNTER — Other Ambulatory Visit: Payer: Self-pay

## 2023-10-11 DIAGNOSIS — G43009 Migraine without aura, not intractable, without status migrainosus: Secondary | ICD-10-CM

## 2023-10-11 MED ORDER — RIZATRIPTAN BENZOATE 10 MG PO TABS: 10.0000 mg | ORAL_TABLET | ORAL | 0 refills | Status: AC | PRN

## 2023-10-18 ENCOUNTER — Ambulatory Visit
Admission: RE | Admit: 2023-10-18 | Discharge: 2023-10-18 | Disposition: A | Discharge status: Home / Self Care | Payer: Managed Care, Other (non HMO) | Source: Ambulatory Visit | Attending: Obstetrics and Gynecology | Admitting: Obstetrics and Gynecology

## 2023-10-18 DIAGNOSIS — Z01419 Encounter for gynecological examination (general) (routine) without abnormal findings: Secondary | ICD-10-CM

## 2023-10-18 DIAGNOSIS — Z1239 Encounter for other screening for malignant neoplasm of breast: Secondary | ICD-10-CM

## 2023-12-03 ENCOUNTER — Ambulatory Visit: Payer: Managed Care, Other (non HMO) | Admitting: Family

## 2023-12-03 ENCOUNTER — Encounter: Payer: Self-pay | Admitting: Family

## 2023-12-03 VITALS — BP 134/86 | HR 110 | Temp 98.1°F | Ht 69.0 in | Wt 198.0 lb

## 2023-12-03 DIAGNOSIS — M25551 Pain in right hip: Secondary | ICD-10-CM

## 2023-12-03 DIAGNOSIS — Z862 Personal history of diseases of the blood and blood-forming organs and certain disorders involving the immune mechanism: Secondary | ICD-10-CM

## 2023-12-03 DIAGNOSIS — F411 Generalized anxiety disorder: Secondary | ICD-10-CM

## 2023-12-03 DIAGNOSIS — F902 Attention-deficit hyperactivity disorder, combined type: Secondary | ICD-10-CM | POA: Diagnosis not present

## 2023-12-03 DIAGNOSIS — Z79899 Other long term (current) drug therapy: Secondary | ICD-10-CM | POA: Insufficient documentation

## 2023-12-03 DIAGNOSIS — R7989 Other specified abnormal findings of blood chemistry: Secondary | ICD-10-CM

## 2023-12-03 DIAGNOSIS — F338 Other recurrent depressive disorders: Secondary | ICD-10-CM

## 2023-12-03 DIAGNOSIS — G8929 Other chronic pain: Secondary | ICD-10-CM | POA: Insufficient documentation

## 2023-12-03 DIAGNOSIS — M722 Plantar fascial fibromatosis: Secondary | ICD-10-CM | POA: Insufficient documentation

## 2023-12-03 DIAGNOSIS — E7800 Pure hypercholesterolemia, unspecified: Secondary | ICD-10-CM

## 2023-12-03 DIAGNOSIS — M2550 Pain in unspecified joint: Secondary | ICD-10-CM

## 2023-12-03 MED ORDER — AMPHETAMINE-DEXTROAMPHET ER 20 MG PO CP24: 20.0000 mg | ORAL_CAPSULE | ORAL | 0 refills | Status: DC

## 2023-12-03 MED ORDER — AMPHETAMINE-DEXTROAMPHETAMINE 10 MG PO TABS
ORAL_TABLET | ORAL | 0 refills | Status: DC
Start: 2023-12-24 — End: 2024-04-21

## 2023-12-03 MED ORDER — AMPHETAMINE-DEXTROAMPHET ER 20 MG PO CP24
20.0000 mg | ORAL_CAPSULE | ORAL | 0 refills | Status: DC
Start: 2023-12-24 — End: 2024-04-21

## 2023-12-03 MED ORDER — AMPHETAMINE-DEXTROAMPHETAMINE 10 MG PO TABS: ORAL_TABLET | ORAL | 0 refills | Status: DC

## 2023-12-03 MED ORDER — SERTRALINE HCL 100 MG PO TABS: ORAL_TABLET | ORAL | 3 refills | Status: AC

## 2023-12-03 MED ORDER — MELOXICAM 7.5 MG PO TABS: 7.5000 mg | ORAL_TABLET | Freq: Every day | ORAL | 0 refills | Status: DC

## 2023-12-03 NOTE — Assessment & Plan Note (Signed)
Refill adderall 20 mg and also 20 mg XR  Doing well Pdmp reviewed

## 2023-12-03 NOTE — Assessment & Plan Note (Signed)
Ongoing.  Will change from naproxen to mobic 7.5 mg once daily  Safer on the stomach, on pantoprazole

## 2023-12-03 NOTE — Assessment & Plan Note (Signed)
Worsening Increase sertraline to 200 mg nightly  Discussed light therapy for SAD

## 2023-12-03 NOTE — Patient Instructions (Signed)
  A referral was placed today for psychology Please let us know if you have not heard back within 2 weeks about the referral.  Increase sertraline 200 mg once daily.

## 2023-12-03 NOTE — Progress Notes (Signed)
Established Patient Office Visit  Subjective:      CC:  Chief Complaint  Patient presents with   Medical Management of Chronic Issues    HPI: Erica Wells is a 43 y.o. female presenting on 12/03/2023 for Medical Management of Chronic Issues  Anxiety: she states that the last six months have been really tough for her. A new work situation which might be contributing and she states not coping very well. She has had to take klonipin more than usual. At other times she has gone a year without taking it however she finds she is taking more a few times a week. She is on sertraline 100 mg. When anxiety worsens she has heightened anxiety she has more panic attacks with chest tightness, and she starts drinking more alcohol which is her coping mechanism. She is on night shift and never sees the sun which adds to the situation. She feels she is suffering as well for seasonal depression anxiety. She has went to therapy in the past and wasn't quite the right therapy.   Denies HI SI   ADD: on adderall 20 mg XR and adderall 20 mg in afternoon when needed. Doesn't always take everyday.  GERD: long term ppi pantoprazole , she has seen GI had upper EGD and colonoscopy. She has a lazy spinchter, she has tried to go off of ppi and try only pepcid but had worsening symptoms. She is also on cholestyramine  Over the last two months with increasing tenderness on left arch of foot. Hard to bear weight and walk. Tries to wear supportive shoes. She stands a lot at work on hard floors which isn't helpful.   Chronic right hpi pain, five years ago had a tear in her 'joint' because her femur 'was too jagged' had to fix tear and grind down bone . She takes naproxen for this daily.    Social history:  Relevant past medical, surgical, family and social history reviewed and updated as indicated. Interim medical history since our last visit reviewed.  Allergies and medications reviewed and updated.  DATA  REVIEWED: CHART IN EPIC     ROS: Negative unless specifically indicated above in HPI.    Current Outpatient Medications:    amitriptyline (ELAVIL) 10 MG tablet, Take 3 tablets (30 mg total) by mouth at bedtime., Disp: 270 tablet, Rfl: 1   Azelastine-Fluticasone 137-50 MCG/ACT SUSP, Place 1 spray into the nose every 12 (twelve) hours., Disp: 23 g, Rfl: 5   cholestyramine (QUESTRAN) 4 GM/DOSE powder, Take 1 packet (4 g total) by mouth 2 (two) times daily with a meal., Disp: 240 g, Rfl: 11   clonazePAM (KLONOPIN) 1 MG tablet, Take by mouth., Disp: , Rfl:    Drospirenone (SLYND) 4 MG TABS, Take 1 tablet (4 mg total) by mouth daily., Disp: 365 tablet, Rfl: 0   meloxicam (MOBIC) 7.5 MG tablet, Take 1 tablet (7.5 mg total) by mouth daily., Disp: 90 tablet, Rfl: 0   pantoprazole (PROTONIX) 20 MG tablet, Take 20 mg by mouth daily., Disp: , Rfl:    rizatriptan (MAXALT) 10 MG tablet, Take 1 tablet (10 mg total) by mouth as needed for migraine. May repeat in 2 hours if needed, Disp: 10 tablet, Rfl: 0   sertraline (ZOLOFT) 100 MG tablet, Take two tablets nightly, Disp: 180 tablet, Rfl: 3   topiramate (TOPAMAX) 50 MG tablet, TAKE 1 TABLET BY MOUTH TWICE  DAILY, Disp: 180 tablet, Rfl: 1   amphetamine-dextroamphetamine (ADDERALL XR) 20 MG 24  hr capsule, Take 1 capsule (20 mg total) by mouth every morning., Disp: 30 capsule, Rfl: 0   [START ON 12/24/2023] amphetamine-dextroamphetamine (ADDERALL XR) 20 MG 24 hr capsule, Take 1 capsule (20 mg total) by mouth every morning., Disp: 30 capsule, Rfl: 0   [START ON 01/14/2024] amphetamine-dextroamphetamine (ADDERALL XR) 20 MG 24 hr capsule, Take 1 capsule (20 mg total) by mouth every morning., Disp: 30 capsule, Rfl: 0   amphetamine-dextroamphetamine (ADDERALL) 10 MG tablet, Take one po qd prn around 1-2 pm, Disp: 30 tablet, Rfl: 0   [START ON 12/24/2023] amphetamine-dextroamphetamine (ADDERALL) 10 MG tablet, Take one po qd prn around 1-2 pm, Disp: 30 tablet, Rfl: 0    [START ON 01/14/2024] amphetamine-dextroamphetamine (ADDERALL) 10 MG tablet, Take one po qd prn around 1-2 pm, Disp: 30 tablet, Rfl: 0      Objective:    BP 134/86 (BP Location: Left Arm, Patient Position: Sitting, Cuff Size: Normal)   Pulse (!) 110   Temp 98.1 F (36.7 C) (Temporal)   Ht 5\' 9"  (1.753 m)   Wt 198 lb (89.8 kg)   LMP 04/16/2019   SpO2 99%   BMI 29.24 kg/m   Wt Readings from Last 3 Encounters:  12/03/23 198 lb (89.8 kg)  09/21/23 200 lb (90.7 kg)  06/11/23 204 lb 2 oz (92.6 kg)    Physical Exam Vitals reviewed.  Constitutional:      General: She is not in acute distress.    Appearance: Normal appearance. She is normal weight. She is not ill-appearing, toxic-appearing or diaphoretic.  HENT:     Head: Normocephalic.  Cardiovascular:     Rate and Rhythm: Normal rate.  Pulmonary:     Effort: Pulmonary effort is normal.  Musculoskeletal:     Left foot: Decreased range of motion (pain with flexion).  Neurological:     General: No focal deficit present.     Mental Status: She is alert and oriented to person, place, and time. Mental status is at baseline.  Psychiatric:        Mood and Affect: Mood normal.        Behavior: Behavior normal.        Thought Content: Thought content normal.        Judgment: Judgment normal.           Assessment & Plan:  Anxiety, generalized Assessment & Plan: Worsening Increase sertraline to 200 mg nightly  Discussed light therapy for SAD  Orders: -     Ambulatory referral to Psychology -     Sertraline HCl; Take two tablets nightly  Dispense: 180 tablet; Refill: 3  Long-term current use of proton pump inhibitor therapy  Seasonal depression (HCC) -     Sertraline HCl; Take two tablets nightly  Dispense: 180 tablet; Refill: 3  ADHD (attention deficit hyperactivity disorder), combined type Assessment & Plan: Refill adderall 20 mg and also 20 mg XR  Doing well Pdmp reviewed  Orders: -      Amphetamine-Dextroamphetamine; Take one po qd prn around 1-2 pm  Dispense: 30 tablet; Refill: 0 -     Amphetamine-Dextroamphet ER; Take 1 capsule (20 mg total) by mouth every morning.  Dispense: 30 capsule; Refill: 0 -     Amphetamine-Dextroamphetamine; Take one po qd prn around 1-2 pm  Dispense: 30 tablet; Refill: 0 -     Amphetamine-Dextroamphetamine; Take one po qd prn around 1-2 pm  Dispense: 30 tablet; Refill: 0 -     Amphetamine-Dextroamphet ER; Take 1  capsule (20 mg total) by mouth every morning.  Dispense: 30 capsule; Refill: 0 -     Amphetamine-Dextroamphet ER; Take 1 capsule (20 mg total) by mouth every morning.  Dispense: 30 capsule; Refill: 0  High risk medication use -     DRUG MONITORING, PANEL 8 WITH CONFIRMATION, URINE  Plantar fasciitis of left foot Assessment & Plan: Handout given to pt  Work on stretching exercises, heat/ice to site.  Can follow with orthopedist if needed, could benefit from steroid injection  Naproxen prn    Chronic pain of right hip Assessment & Plan: Ongoing.  Will change from naproxen to mobic 7.5 mg once daily  Safer on the stomach, on pantoprazole  Orders: -     Meloxicam; Take 1 tablet (7.5 mg total) by mouth daily.  Dispense: 90 tablet; Refill: 0  Polyarthralgia -     ANA w/Reflex -     C-reactive protein -     Rheumatoid factor -     Comprehensive metabolic panel; Future  History of anemia -     CBC  Low serum vitamin D -     VITAMIN D 25 Hydroxy (Vit-D Deficiency, Fractures)  Elevated LDL cholesterol level -     Lipid panel     Return in about 3 months (around 03/01/2024) for f/u anxiety, f/u ADD medication.  Mort Sawyers, MSN, APRN, FNP-C  [facility]

## 2023-12-03 NOTE — Assessment & Plan Note (Signed)
Handout given to pt  Work on stretching exercises, heat/ice to site.  Can follow with orthopedist if needed, could benefit from steroid injection  Naproxen prn

## 2023-12-04 ENCOUNTER — Encounter: Payer: Self-pay | Admitting: Family

## 2023-12-04 LAB — DRUG PROFILE, UR, 9 DRUGS (LABCORP)
Amphetamines, Urine: NEGATIVE ng/mL
Barbiturate Quant, Ur: NEGATIVE ng/mL
Benzodiazepine Quant, Ur: NEGATIVE ng/mL
Cannabinoid Quant, Ur: NEGATIVE ng/mL
Cocaine (Metab.): NEGATIVE ng/mL
Methadone Screen, Urine: NEGATIVE ng/mL
Opiate Quant, Ur: NEGATIVE ng/mL
PCP Quant, Ur: NEGATIVE ng/mL
Propoxyphene: NEGATIVE ng/mL

## 2023-12-04 LAB — LIPID PANEL
Chol/HDL Ratio: 5 {ratio} — ABNORMAL HIGH (ref 0.0–4.4)
Cholesterol, Total: 218 mg/dL — ABNORMAL HIGH (ref 100–199)
HDL: 44 mg/dL (ref >39)
LDL Chol Calc (NIH): 145 mg/dL — ABNORMAL HIGH (ref 0–99)
Triglycerides: 163 mg/dL — ABNORMAL HIGH (ref 0–149)
VLDL Cholesterol Cal: 29 mg/dL (ref 5–40)

## 2023-12-04 LAB — CBC
Hematocrit: 43.4 % (ref 34.0–46.6)
Hemoglobin: 14.5 g/dL (ref 11.1–15.9)
MCH: 30.6 pg (ref 26.6–33.0)
MCHC: 33.4 g/dL (ref 31.5–35.7)
MCV: 92 fL (ref 79–97)
Platelets: 362 10*3/uL (ref 150–450)
RBC: 4.74 x10E6/uL (ref 3.77–5.28)
RDW: 12.6 % (ref 11.7–15.4)
WBC: 8.9 10*3/uL (ref 3.4–10.8)

## 2023-12-04 LAB — RHEUMATOID FACTOR: Rheumatoid fact SerPl-aCnc: <10 [IU]/mL (ref <14.0)

## 2023-12-04 LAB — ANA W/REFLEX: Anti Nuclear Antibody (ANA): NEGATIVE

## 2023-12-04 LAB — VITAMIN D 25 HYDROXY (VIT D DEFICIENCY, FRACTURES): Vit D, 25-Hydroxy: 30.3 ng/mL (ref 30.0–100.0)

## 2023-12-04 LAB — C-REACTIVE PROTEIN: CRP: 3 mg/L (ref 0–10)

## 2023-12-07 ENCOUNTER — Other Ambulatory Visit: Payer: Self-pay

## 2023-12-07 ENCOUNTER — Encounter: Payer: Self-pay | Admitting: Gastroenterology

## 2023-12-07 MED ORDER — COLESTIPOL HCL 1 G PO TABS
1.0000 g | ORAL_TABLET | Freq: Two times a day (BID) | ORAL | 11 refills | Status: AC
Start: 2023-12-07 — End: ?

## 2023-12-09 ENCOUNTER — Other Ambulatory Visit: Payer: Self-pay | Admitting: Family

## 2024-01-15 ENCOUNTER — Other Ambulatory Visit: Payer: Self-pay | Admitting: Family

## 2024-02-03 ENCOUNTER — Other Ambulatory Visit: Payer: Self-pay | Admitting: Family

## 2024-02-03 DIAGNOSIS — G8929 Other chronic pain: Secondary | ICD-10-CM

## 2024-03-06 ENCOUNTER — Ambulatory Visit: Admitting: Family

## 2024-03-06 ENCOUNTER — Encounter: Payer: Self-pay | Admitting: Family

## 2024-03-06 VITALS — BP 112/70 | HR 89 | Temp 98.3°F | Ht 69.0 in | Wt 189.0 lb

## 2024-03-06 DIAGNOSIS — E782 Mixed hyperlipidemia: Secondary | ICD-10-CM | POA: Diagnosis not present

## 2024-03-06 DIAGNOSIS — F411 Generalized anxiety disorder: Secondary | ICD-10-CM

## 2024-03-06 DIAGNOSIS — M25551 Pain in right hip: Secondary | ICD-10-CM | POA: Diagnosis not present

## 2024-03-06 DIAGNOSIS — G8929 Other chronic pain: Secondary | ICD-10-CM

## 2024-03-06 DIAGNOSIS — F902 Attention-deficit hyperactivity disorder, combined type: Secondary | ICD-10-CM | POA: Diagnosis not present

## 2024-03-06 NOTE — Assessment & Plan Note (Signed)
 Ongoing. Reviewed MRI  Advised pt to f/u with orthopedist as not improving.  Meloxicam  was ineffective.

## 2024-03-06 NOTE — Assessment & Plan Note (Signed)
Work on low cholesterol diet and exercise as tolerated  

## 2024-03-06 NOTE — Assessment & Plan Note (Signed)
 Improving Continue sertraline  200 mg once daily

## 2024-03-06 NOTE — Assessment & Plan Note (Signed)
 Doing well.  Drug contract signed today. [employer] [employer] Ref[facility] sent for adderall XR 20 mg and adderall IR 10 mg once daily Pdmp reviewed.

## 2024-03-06 NOTE — Progress Notes (Signed)
 Established Patient Office Visit  Subjective:   Patient ID: Erica Wells, female    DOB: 24-Dec-1980  Age: 43 y.o. MRN: [id number]  CC:  Chief Complaint  Patient presents with   Medical Management of Chronic Issues    HPI: Erica Wells is a 43 y.o. 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   Anxiety, last visit increased sertraline  to 200 mg once daily and she states has been great with increase. No longer with increased blood pressure and feeling much less anxiety. No cp palp and or sob.   Ongoing right hip pain, tried meloxicam  without any relief of symptoms.autoimmune workup negative. Has seen orthopedist in the past for this, CT hip no abn findings.  MRI right hip 2019 chondral thinning hip with mild spurring of both femoral heads disc desiccation L5-S1, hip arthroscopy 2019 , she does see Dr. Lydia Sams but has not seen him in 2024.   HLD: elevated LDL 145. States does have genetic h/o hyperlipidemia.  Has been trying to focux on a low fat diet and has cut down on beer intake. Very active overall but no established exercise routine.          ROS: Negative unless specifically indicated above in HPI.   Relevant past medical history reviewed and updated as indicated.   Allergies and medications reviewed and updated.   Current Outpatient Medications:    amitriptyline  (ELAVIL ) 10 MG tablet, TAKE 3 TABLETS BY MOUTH AT  BEDTIME, Disp: 270 tablet, Rfl: 3   amphetamine -dextroamphetamine   (ADDERALL XR) 20 MG 24 hr capsule, Take 1 capsule (20 mg total) by mouth every morning., Disp: 30 capsule, Rfl: 0   amphetamine -dextroamphetamine  (ADDERALL XR) 20 MG 24 hr capsule, Take 1 capsule (20 mg total) by mouth every morning., Disp: 30 capsule, Rfl: 0   amphetamine -dextroamphetamine  (ADDERALL XR) 20 MG 24 hr capsule, Take 1 capsule (20 mg total) by mouth every morning., Disp: 30 capsule, Rfl: 0   amphetamine -dextroamphetamine  (ADDERALL) 10 MG tablet, Take one po qd prn around 1-2 pm, Disp: 30 tablet, Rfl: 0   amphetamine -dextroamphetamine  (ADDERALL) 10 MG tablet, Take one po qd prn around 1-2 pm, Disp: 30 tablet, Rfl: 0   amphetamine -dextroamphetamine  (ADDERALL) 10 MG tablet, Take one po qd prn around 1-2 pm, Disp: 30 tablet, Rfl: 0   Azelastine -Fluticasone  137-50 MCG/ACT SUSP, Place 1 spray into the nose every 12 (twelve) hours., Disp: 23 g, Rfl: 5   clonazePAM  (KLONOPIN ) 1 MG tablet, Take by mouth., Disp: , Rfl:    colestipol  (COLESTID ) 1 g tablet, Take 1 tablet (1 g total) by mouth 2 (two) times daily., Disp: 60 tablet, Rfl: 11   Drospirenone  (SLYND ) 4 MG TABS, Take 1 tablet (4 mg total) by mouth daily., Disp: 365 tablet, Rfl: 0   meloxicam  (MOBIC ) 7.5 MG tablet, TAKE 1 TABLET BY MOUTH DAILY, Disp: 90 tablet, Rfl: 3   pantoprazole  (PROTONIX ) 20 MG tablet, TAKE 1 TABLET  BY MOUTH DAILY, Disp: 90 tablet, Rfl: 3   rizatriptan  (MAXALT ) 10 MG tablet, Take 1 tablet (10 mg total) by mouth as needed for migraine. May repeat in 2 hours if needed, Disp: 10 tablet, Rfl: 0   sertraline  (ZOLOFT ) 100 MG tablet, Take two tablets nightly, Disp: 180 tablet, Rfl: 3   topiramate  (TOPAMAX ) 50 MG tablet, TAKE 1 TABLET BY MOUTH TWICE  DAILY, Disp: 180 tablet, Rfl: 1  Allergies  Allergen Reactions   Penicillins Anaphylaxis    Has patient had a PCN reaction causing immediate rash, facial/tongue/throat swelling, SOB or lightheadedness with hypotension: Yes Has patient had a PCN reaction causing severe rash  involving mucus membranes or skin necrosis: No Has patient had a PCN reaction that required hospitalization: Yes Has patient had a PCN reaction occurring within the last 10 years: No If all of the above answers are "NO", then may proceed with Cephalosporin use.     Objective:   BP 112/70 (BP Location: Left Arm, Patient Position: Sitting, Cuff Size: Normal)   Pulse 89   Temp 98.3 F (36.8 C) (Temporal)   Ht 5\' 9"  (1.753 m)   Wt 189 lb (85.7 kg)   LMP 04/16/2019   SpO2 98%   BMI 27.91 kg/m    Physical Exam Constitutional:      General: She is not in acute distress.    Appearance: Normal appearance. She is normal weight. She is not ill-appearing, toxic-appearing or diaphoretic.  HENT:     Head: Normocephalic.  Cardiovascular:     Rate and Rhythm: Normal rate and regular rhythm.  Pulmonary:     Effort: Pulmonary effort is normal.  Musculoskeletal:        General: Normal range of motion.  Neurological:     General: No focal deficit present.     Mental Status: She is alert and oriented to person, place, and time. Mental status is at baseline.  Psychiatric:        Mood and Affect: Mood normal.        Behavior: Behavior normal.        Thought Content: Thought content normal.        Judgment: Judgment normal.     Assessment & Plan:  ADHD (attention deficit hyperactivity disorder), combined type Assessment & Plan: Doing well.  Drug contract signed today. [facility] [employer] Refills sent for adderall XR 20 mg and adderall IR 10 mg once daily Pdmp reviewed.   Chronic pain of right hip Assessment & Plan: Ongoing. Reviewed MRI  Advised pt to f/u with orthopedist as not improving.  Meloxicam  was ineffective.    Mixed hyperlipidemia Assessment & Plan: Work on low cholesterol diet and exercise as tolerated     Anxiety, generalized Assessment & Plan: Improving Continue sertraline  200 mg once daily       Follow up plan: Return in about 3 months (around 06/06/2024) for f/u  ADD medication.  Felicita Horns, FNP

## 2024-04-10 ENCOUNTER — Other Ambulatory Visit: Payer: Self-pay | Admitting: Family

## 2024-04-15 ENCOUNTER — Encounter: Payer: Self-pay | Admitting: Family

## 2024-04-15 DIAGNOSIS — F902 Attention-deficit hyperactivity disorder, combined type: Secondary | ICD-10-CM

## 2024-04-21 MED ORDER — AMPHETAMINE-DEXTROAMPHET ER 20 MG PO CP24: 20.0000 mg | ORAL_CAPSULE | ORAL | 0 refills | Status: DC

## 2024-04-21 MED ORDER — AMPHETAMINE-DEXTROAMPHETAMINE 10 MG PO TABS: ORAL_TABLET | ORAL | 0 refills | Status: DC

## 2024-05-06 ENCOUNTER — Encounter (INDEPENDENT_AMBULATORY_CARE_PROVIDER_SITE_OTHER): Payer: Self-pay | Admitting: Family

## 2024-05-06 DIAGNOSIS — E782 Mixed hyperlipidemia: Secondary | ICD-10-CM

## 2024-05-08 NOTE — Telephone Encounter (Signed)

## 2024-06-03 ENCOUNTER — Encounter: Payer: Self-pay | Admitting: Family

## 2024-06-05 ENCOUNTER — Ambulatory Visit: Admitting: Family

## 2024-06-05 ENCOUNTER — Encounter: Payer: Self-pay | Admitting: Family

## 2024-06-05 DIAGNOSIS — E785 Hyperlipidemia, unspecified: Secondary | ICD-10-CM

## 2024-06-05 DIAGNOSIS — G43909 Migraine, unspecified, not intractable, without status migrainosus: Secondary | ICD-10-CM

## 2024-06-05 DIAGNOSIS — K915 Postcholecystectomy syndrome: Secondary | ICD-10-CM

## 2024-06-05 DIAGNOSIS — F902 Attention-deficit hyperactivity disorder, combined type: Secondary | ICD-10-CM | POA: Diagnosis not present

## 2024-06-05 DIAGNOSIS — F419 Anxiety disorder, unspecified: Secondary | ICD-10-CM | POA: Diagnosis not present

## 2024-06-05 MED ORDER — AMPHETAMINE-DEXTROAMPHET ER 20 MG PO CP24: 20.0000 mg | ORAL_CAPSULE | ORAL | 0 refills | Status: DC

## 2024-06-05 MED ORDER — AMPHETAMINE-DEXTROAMPHETAMINE 10 MG PO TABS: ORAL_TABLET | ORAL | 0 refills | Status: DC

## 2024-06-05 NOTE — Progress Notes (Signed)
 Established Patient Office Visit  Subjective:      CC:  Chief Complaint  Patient presents with   Medical Management of Chronic Issues    HPI: Erica Wells is a 43 y.o. female presenting on 06/05/2024 for Medical Management of Chronic Issues .  Discussed the use of AI scribe software for clinical note transcription with the patient, who gave verbal consent to proceed.  History of Present Illness Erica Wells is a 43 year old female who presents for medication management and follow-up.  She is currently on a medication regimen that includes Adderall 20 mg extended release and 10 mg short-acting release, sertraline  100 mg, clonazepam  as needed, Topamax  for headaches, and amitriptyline  daily. Her headaches have significantly improved with Topamax , and she rarely needs to use rizatriptan , approximately once a month.  She has a history of IBS and cholecystectomy, which complicates her gastrointestinal symptoms. She experiences bile salt-mediated diarrhea, which is stable on colestipol . Her symptoms fluctuate based on her diet, with bland foods during the week causing fewer issues compared to more varied foods on the weekends. She describes this as a 'balancing act' and notes that her body tolerates 'Kix and skim milk' well during the week.  She reports a family history of high cholesterol and notes her own cholesterol levels are consistently over 200, with an LDL of 145. She has no history of high blood pressure or diabetes and is a non-smoker. She had a hysterectomy at age 63.  No chest pain, palpitations, or shortness of breath.         Social history:  Relevant past medical, surgical, family and social history reviewed and updated as indicated. Interim medical history since our last visit reviewed.  Allergies and medications reviewed and updated.  DATA REVIEWED: CHART IN EPIC     ROS: Negative unless specifically indicated above in HPI.    Current Outpatient  Medications:    amitriptyline  (ELAVIL ) 10 MG tablet, TAKE 3 TABLETS BY MOUTH AT  BEDTIME, Disp: 270 tablet, Rfl: 3   Azelastine -Fluticasone  137-50 MCG/ACT SUSP, Place 1 spray into the nose every 12 (twelve) hours., Disp: 23 g, Rfl: 5   clonazePAM  (KLONOPIN ) 1 MG tablet, Take by mouth., Disp: , Rfl:    colestipol  (COLESTID ) 1 g tablet, Take 1 tablet (1 g total) by mouth 2 (two) times daily., Disp: 60 tablet, Rfl: 11   Drospirenone  (SLYND ) 4 MG TABS, Take 1 tablet (4 mg total) by mouth daily., Disp: 365 tablet, Rfl: 0   meloxicam  (MOBIC ) 7.5 MG tablet, TAKE 1 TABLET BY MOUTH DAILY, Disp: 90 tablet, Rfl: 3   pantoprazole  (PROTONIX ) 20 MG tablet, TAKE 1 TABLET BY MOUTH DAILY, Disp: 90 tablet, Rfl: 3   rizatriptan  (MAXALT ) 10 MG tablet, Take 1 tablet (10 mg total) by mouth as needed for migraine. May repeat in 2 hours if needed, Disp: 10 tablet, Rfl: 0   sertraline  (ZOLOFT ) 100 MG tablet, Take two tablets nightly, Disp: 180 tablet, Rfl: 3   topiramate  (TOPAMAX ) 50 MG tablet, TAKE 1 TABLET BY MOUTH TWICE  DAILY, Disp: 180 tablet, Rfl: 3   amphetamine -dextroamphetamine  (ADDERALL XR) 20 MG 24 hr capsule, Take 1 capsule (20 mg total) by mouth every morning., Disp: 30 capsule, Rfl: 0   [START ON 06/26/2024] amphetamine -dextroamphetamine  (ADDERALL XR) 20 MG 24 hr capsule, Take 1 capsule (20 mg total) by mouth every morning., Disp: 30 capsule, Rfl: 0   [START ON 07/17/2024] amphetamine -dextroamphetamine  (ADDERALL XR) 20 MG 24 hr capsule, Take 1  capsule (20 mg total) by mouth every morning., Disp: 30 capsule, Rfl: 0   amphetamine -dextroamphetamine  (ADDERALL) 10 MG tablet, Take one po qd prn around 1-2 pm, Disp: 30 tablet, Rfl: 0   [START ON 06/26/2024] amphetamine -dextroamphetamine  (ADDERALL) 10 MG tablet, Take one po qd prn around 1-2 pm, Disp: 30 tablet, Rfl: 0   [START ON 07/17/2024] amphetamine -dextroamphetamine  (ADDERALL) 10 MG tablet, Take one po qd prn around 1-2 pm, Disp: 30 tablet, Rfl: 0         Objective:        BP 108/66 (BP Location: Left Arm, Patient Position: Sitting, Cuff Size: Normal)   Pulse 67   Temp 98.6 F (37 C) (Temporal)   Ht 5' 9 (1.753 m)   Wt 194 lb 6.4 oz (88.2 kg)   LMP 04/16/2019   SpO2 98%   BMI 28.71 kg/m   Physical Exam VITALS: BP- 108/  Wt Readings from Last 3 Encounters:  06/05/24 194 lb 6.4 oz (88.2 kg)  03/06/24 189 lb (85.7 kg)  12/03/23 198 lb (89.8 kg)    Physical Exam Vitals reviewed.  Constitutional:      General: She is not in acute distress.    Appearance: Normal appearance. She is normal weight. She is not ill-appearing, toxic-appearing or diaphoretic.  HENT:     Head: Normocephalic.  Cardiovascular:     Rate and Rhythm: Normal rate.  Pulmonary:     Effort: Pulmonary effort is normal.  Musculoskeletal:        General: Normal range of motion.  Neurological:     General: No focal deficit present.     Mental Status: She is alert and oriented to person, place, and time. Mental status is at baseline.  Psychiatric:        Mood and Affect: Mood normal.        Behavior: Behavior normal.        Thought Content: Thought content normal.        Judgment: Judgment normal.          Results LABS Total cholesterol: 218 mg/dL HDL cholesterol: 44 mg/dL LDL cholesterol: 854 mg/dL  Assessment & Plan:   Assessment and Plan Assessment & Plan Attention-deficit hyperactivity disorder, combined type ADHD is managed with amphetamine -dextroamphetamine  regimen, consisting of 20 mg extended-release in the morning and 10 mg short-acting in the afternoon. - Continue current amphetamine -dextroamphetamine  regimen. - Send prescription for Adderall to [facility] on [address].  Generalized anxiety disorder Anxiety is well-managed with sertraline  100 mg nightly. No recent need for clonazepam  indicates effective control. - Continue sertraline  100 mg nightly. - Clonazepam  to be used as needed.  Migraine, well-controlled on current  regimen Migraines are well-controlled with topiramate , with rare use of rizatriptan  indicating effective prophylactic management. - Continue topiramate  50 mg twice daily. - Use rizatriptan  as needed for breakthrough migraines.  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 Symptoms are stable with bland foods during the week, with exacerbations on weekends due to varied diet. - Continue current dietary management and medication regimen. - Consider dietary modifications to minimize symptoms.  Mixed hyperlipidemia Mixed hyperlipidemia with total cholesterol over 200 mg/dL and LDL at 854 mg/dL. A 10-year cardiovascular risk of 0.8% does not warrant statin therapy. Family history suggests a genetic component. - Encourage healthy cardiovascular lifestyle, including exercise when tolerated.  Recording duration: 9 minutes  ASCVD risk score calculated at 0.8%  No statin recommended because 10-year risk <5%; always  encourage healthy cardiovascular lifestyle choices. Some patients with other high risk features may still be appropriate for treatment.   Return in about 3 months (around 09/05/2024) for f/u CPE.     Ginger Patrick, MSN, APRN, FNP-C  [facility]

## 2024-10-02 ENCOUNTER — Encounter: Payer: Self-pay | Admitting: Family

## 2024-10-02 ENCOUNTER — Ambulatory Visit: Admitting: Family

## 2024-10-02 VITALS — BP 108/78 | HR 95 | Temp 98.2°F | Ht 69.0 in | Wt 209.0 lb

## 2024-10-02 DIAGNOSIS — F411 Generalized anxiety disorder: Secondary | ICD-10-CM | POA: Diagnosis not present

## 2024-10-02 DIAGNOSIS — F902 Attention-deficit hyperactivity disorder, combined type: Secondary | ICD-10-CM | POA: Diagnosis not present

## 2024-10-02 DIAGNOSIS — G43111 Migraine with aura, intractable, with status migrainosus: Secondary | ICD-10-CM | POA: Diagnosis not present

## 2024-10-02 DIAGNOSIS — R232 Flushing: Secondary | ICD-10-CM

## 2024-10-02 DIAGNOSIS — E782 Mixed hyperlipidemia: Secondary | ICD-10-CM

## 2024-10-02 DIAGNOSIS — Z23 Encounter for immunization: Secondary | ICD-10-CM | POA: Diagnosis not present

## 2024-10-02 MED ORDER — AMPHETAMINE-DEXTROAMPHET ER 20 MG PO CP24: 20.0000 mg | ORAL_CAPSULE | ORAL | 0 refills | Status: AC

## 2024-10-02 MED ORDER — AMPHETAMINE-DEXTROAMPHETAMINE 10 MG PO TABS: ORAL_TABLET | ORAL | 0 refills | Status: AC

## 2024-10-02 MED ORDER — AMITRIPTYLINE HCL 50 MG PO TABS: 50.0000 mg | ORAL_TABLET | Freq: Every day | ORAL | 2 refills | Status: AC

## 2024-10-02 NOTE — Progress Notes (Signed)
 "  Established Patient Office Visit  Subjective:      CC:  Chief Complaint  Patient presents with   Medical Management of Chronic Issues    HPI: Erica Wells is a 43 y.o. female presenting on 10/02/2024 for Medical Management of Chronic Issues .  Discussed the use of AI scribe software for clinical note transcription with the patient, who gave verbal consent to proceed.  History of Present Illness Erica Wells is a 43 year old female with migraines and anxiety who presents for medication management and follow-up.  She reports that her migraines have felt worse recently despite being on topiramate  50 mg twice daily. She experienced a severe migraine recently while driving, which was the worst she has had in a long time. Her migraines are triggered by stress and light, occur less than fifteen times a month, and are accompanied by an aura and light sensitivity. Rizatriptan  provides temporary relief for a few hours. She has been experiencing migraines since 2019, with no identified root cause despite an MRI and hormone checks.  She is currently taking sertraline  200 mg daily for anxiety, which she finds effective. Situations that previously caused anxiety, such as large gatherings, are no longer problematic. She does not currently see a therapist but prefers cognitive behavioral therapy over talk therapy.  She is also on Adderall 20 mg extended release with an additional 10 mg as needed in the afternoon, which she finds effective. Clonazepam  is used infrequently. She takes amitriptyline  30 mg for sleep, which was initially prescribed as a sleep aid but also considered for migraine management.  She has been experiencing hot flashes and night sweats for several years, which have recently increased in frequency and now occur during the day. These symptoms are exacerbated by alcohol consumption. She has a history of endometriosis and underwent a hysterectomy, retaining her ovaries. She takes  a progesterone-only birth control pill, Slynd , for endometriosis management.  No chest pain, palpitations, shortness of breath, or loss of function in her arms during migraines.         Social history:  Relevant past medical, surgical, family and social history reviewed and updated as indicated. Interim medical history since our last visit reviewed.  Allergies and medications reviewed and updated.  DATA REVIEWED: CHART IN EPIC     ROS: Negative unless specifically indicated above in HPI.   Current Medications[1]        Objective:        BP 108/78 (BP Location: Left Arm, Patient Position: Sitting, Cuff Size: Normal)   Pulse 95   Temp 98.2 F (36.8 C) (Temporal)   Ht 5' 9 (1.753 m)   Wt 209 lb (94.8 kg)   LMP 04/16/2019   SpO2 97%   BMI 30.86 kg/m   Physical Exam   Wt Readings from Last 3 Encounters:  10/02/24 209 lb (94.8 kg)  06/05/24 194 lb 6.4 oz (88.2 kg)  03/06/24 189 lb (85.7 kg)    Physical Exam Constitutional:      General: She is not in acute distress.    Appearance: Normal appearance. She is normal weight. She is not ill-appearing, toxic-appearing or diaphoretic.  HENT:     Head: Normocephalic.  Neck:     Thyroid: No thyromegaly.  Cardiovascular:     Rate and Rhythm: Normal rate and regular rhythm.  Pulmonary:     Effort: Pulmonary effort is normal.     Breath sounds: Normal breath sounds.  Musculoskeletal:  General: Normal range of motion.     Right lower leg: No edema.     Left lower leg: No edema.  Neurological:     General: No focal deficit present.     Mental Status: She is alert and oriented to person, place, and time. Mental status is at baseline.  Psychiatric:        Mood and Affect: Mood normal.        Behavior: Behavior normal.        Thought Content: Thought content normal.        Judgment: Judgment normal.          Results   Assessment & Plan:   Assessment and Plan Assessment & Plan Migraine with  aura Chronic migraines with aura since 2019, exacerbated by stress and shoulder pain. Current treatment includes topiramate  50 mg twice daily and rizatriptan  as needed, providing partial relief. No neurological deficits reported. Stress and shoulder pain may contribute to increased frequency. - Increased amitriptyline  to 40 mg nightly for one week, then will consider increasing to 50 mg if tolerated. - Continue topiramate  50 mg twice daily. - Continue rizatriptan  as needed for acute migraine relief. - Ordered blood work to rule out other causes of increased migraine frequency.  Attention-deficit hyperactivity disorder, combined type ADHD is well-managed with current medication regimen. - Continue amphetamine -dextroamphetamine  (Adderall XR) 20 mg every morning and 10 mg as needed in the afternoon.  Generalized anxiety disorder Anxiety is well-controlled with sertraline  200 mg daily. No current therapy engagement due to dissatisfaction with previous talk therapy. Prefers cognitive behavioral therapy (CBT) or dialectical behavior therapy (DBT). - Continue sertraline  200 mg daily. - Provided referral to [facility] in [place] for CBT or DBT.  Hot flashes Experiencing hot flashes, particularly at night, with recent onset during the day. Symptoms may be exacerbated by alcohol consumption. No associated palpitations or chest pain. Possible early signs of menopause, but no definitive diagnosis. Current use of progesterone-only birth control (Slynd ) is appropriate given migraine with aura. - Ordered blood work to check thyroid function and other relevant labs. - Continue current progesterone-only birth control (Slynd ).        Return in about 3 months (around 12/31/2024) for f/u ADD medication.     Ginger Patrick, MSN, APRN, FNP-C [facility]        [1]  Current Outpatient Medications:    amitriptyline  (ELAVIL ) 50 MG tablet, Take 1 tablet (50 mg total)  by mouth at bedtime., Disp: 30 tablet, Rfl: 2   Azelastine -Fluticasone  137-50 MCG/ACT SUSP, Place 1 spray into the nose every 12 (twelve) hours., Disp: 23 g, Rfl: 5   clonazePAM  (KLONOPIN ) 1 MG tablet, Take by mouth., Disp: , Rfl:    colestipol  (COLESTID ) 1 g tablet, Take 1 tablet (1 g total) by mouth 2 (two) times daily., Disp: 60 tablet, Rfl: 11   Drospirenone  (SLYND ) 4 MG TABS, Take 1 tablet (4 mg total) by mouth daily., Disp: 365 tablet, Rfl: 0   meloxicam  (MOBIC ) 7.5 MG tablet, TAKE 1 TABLET BY MOUTH DAILY, Disp: 90 tablet, Rfl: 3   pantoprazole  (PROTONIX ) 20 MG tablet, TAKE 1 TABLET BY MOUTH DAILY, Disp: 90 tablet, Rfl: 3   rizatriptan  (MAXALT ) 10 MG tablet, Take 1 tablet (10 mg total) by mouth as needed for migraine. May repeat in 2 hours if needed, Disp: 10 tablet, Rfl: 0   sertraline  (ZOLOFT ) 100 MG tablet, Take two tablets nightly, Disp: 180 tablet, Rfl: 3   topiramate  (  TOPAMAX ) 50 MG tablet, TAKE 1 TABLET BY MOUTH TWICE  DAILY, Disp: 180 tablet, Rfl: 3   [START ON 11/13/2024] amphetamine -dextroamphetamine  (ADDERALL XR) 20 MG 24 hr capsule, Take 1 capsule (20 mg total) by mouth every morning., Disp: 30 capsule, Rfl: 0   [START ON 10/23/2024] amphetamine -dextroamphetamine  (ADDERALL XR) 20 MG 24 hr capsule, Take 1 capsule (20 mg total) by mouth every morning., Disp: 30 capsule, Rfl: 0   amphetamine -dextroamphetamine  (ADDERALL XR) 20 MG 24 hr capsule, Take 1 capsule (20 mg total) by mouth every morning., Disp: 30 capsule, Rfl: 0   [START ON 10/23/2024] amphetamine -dextroamphetamine  (ADDERALL) 10 MG tablet, Take one po qd prn around 1-2 pm, Disp: 30 tablet, Rfl: 0   [START ON 11/13/2024] amphetamine -dextroamphetamine  (ADDERALL) 10 MG tablet, Take one po qd prn around 1-2 pm, Disp: 30 tablet, Rfl: 0   amphetamine -dextroamphetamine  (ADDERALL) 10 MG tablet, Take one po qd prn around 1-2 pm, Disp: 30 tablet, Rfl: 0  "

## 2024-10-02 NOTE — Patient Instructions (Signed)
°  Check out this place for counseling:   [facility]  tel: [phone] [place]   ------------------------------------

## 2024-10-03 ENCOUNTER — Ambulatory Visit: Payer: Self-pay | Admitting: Family

## 2024-10-03 ENCOUNTER — Other Ambulatory Visit: Payer: Self-pay | Admitting: Obstetrics and Gynecology

## 2024-10-03 LAB — CBC WITH DIFFERENTIAL/PLATELET
Basophils Absolute: 0.1 x10E3/uL (ref 0.0–0.2)
Basos: 1 %
EOS (ABSOLUTE): 0.4 x10E3/uL (ref 0.0–0.4)
Eos: 4 %
Hematocrit: 43.2 % (ref 34.0–46.6)
Hemoglobin: 14.2 g/dL (ref 11.1–15.9)
Immature Grans (Abs): 0.1 x10E3/uL (ref 0.0–0.1)
Immature Granulocytes: 1 %
Lymphocytes Absolute: 2.7 x10E3/uL (ref 0.7–3.1)
Lymphs: 33 %
MCH: 30.1 pg (ref 26.6–33.0)
MCHC: 32.9 g/dL (ref 31.5–35.7)
MCV: 92 fL (ref 79–97)
Monocytes Absolute: 0.6 x10E3/uL (ref 0.1–0.9)
Monocytes: 8 %
Neutrophils Absolute: 4.5 x10E3/uL (ref 1.4–7.0)
Neutrophils: 53 %
Platelets: 337 x10E3/uL (ref 150–450)
RBC: 4.71 x10E6/uL (ref 3.77–5.28)
RDW: 12.9 % (ref 11.7–15.4)
WBC: 8.4 x10E3/uL (ref 3.4–10.8)

## 2024-10-03 LAB — LIPID PANEL
Chol/HDL Ratio: 5.2 ratio — ABNORMAL HIGH (ref 0.0–4.4)
Cholesterol, Total: 254 mg/dL — ABNORMAL HIGH (ref 100–199)
HDL: 49 mg/dL
LDL Chol Calc (NIH): 165 mg/dL — ABNORMAL HIGH (ref 0–99)
Triglycerides: 214 mg/dL — ABNORMAL HIGH (ref 0–149)
VLDL Cholesterol Cal: 40 mg/dL (ref 5–40)

## 2024-10-03 LAB — TSH: TSH: 2.84 u[IU]/mL (ref 0.450–4.500)

## 2024-10-24 ENCOUNTER — Other Ambulatory Visit: Payer: Self-pay | Admitting: Orthopedic Surgery

## 2024-10-24 DIAGNOSIS — M25512 Pain in left shoulder: Secondary | ICD-10-CM

## 2024-10-28 ENCOUNTER — Ambulatory Visit
Admission: RE | Admit: 2024-10-28 | Discharge: 2024-10-28 | Disposition: A | Source: Ambulatory Visit | Attending: Orthopedic Surgery

## 2024-10-28 ENCOUNTER — Inpatient Hospital Stay: Admission: RE | Admit: 2024-10-28 | Discharge: 2024-10-28 | Attending: Orthopedic Surgery

## 2024-10-28 DIAGNOSIS — M25512 Pain in left shoulder: Secondary | ICD-10-CM | POA: Diagnosis present

## 2024-10-28 MED ORDER — SODIUM CHLORIDE (PF) 0.9% IJ SOLUTION - NO CHARGE
5.0000 mL | INTRAMUSCULAR | Status: DC | PRN
  Administered 2024-10-28: 5 mL via INTRAVENOUS
  Filled 2024-10-28: qty 6

## 2024-10-28 MED ORDER — LIDOCAINE 1 % OPTIME INJ - NO CHARGE
10.0000 mL | Freq: Once | INTRAMUSCULAR | Status: AC
  Administered 2024-10-28: 10 mL via INTRADERMAL
  Filled 2024-10-28: qty 10

## 2024-10-28 MED ORDER — IOHEXOL 180 MG/ML  SOLN
15.0000 mL | Freq: Once | INTRAMUSCULAR | Status: AC | PRN
  Administered 2024-10-28: 15 mL

## 2024-10-28 MED ORDER — GADOBUTROL 1 MMOL/ML IV SOLN
0.0500 mL | Freq: Once | INTRAVENOUS | Status: AC | PRN
  Administered 2024-10-28: 0.05 mL

## 2024-10-28 NOTE — Procedures (Signed)
 Interventional Radiology Procedure Note  Risks and benefits of arthrogram were discussed with the patient including, but not limited to bleeding, infection, injection of contrast outside the joint, and damage to adjacent structures.  All of the patient's questions were answered, patient is agreeable to proceed. Consent signed and in chart.  A timeout was performed with all members of the team prior to start of the procedure. Correct patient and correct procedure was confirmed. Allergies were reviewed.   PROCEDURE SUMMARY:  Successful fluoro guided left shoulder arthrogram. No immediate complications.  Pt tolerated well.   EBL = none  Please see full dictation in imaging section of Epic for procedure details.   Electronically Signed: Carlin DELENA Griffon, PA-C 10/28/2024, 4:10 PM

## 2024-10-30 ENCOUNTER — Other Ambulatory Visit: Payer: Self-pay | Admitting: Orthopedic Surgery

## 2024-10-31 ENCOUNTER — Ambulatory Visit
Admission: RE | Admit: 2024-10-31 | Discharge: 2024-10-31 | Disposition: A | Discharge status: Home / Self Care | Attending: Orthopedic Surgery | Admitting: Orthopedic Surgery

## 2024-10-31 ENCOUNTER — Ambulatory Visit

## 2024-10-31 ENCOUNTER — Encounter: Payer: Self-pay | Admitting: Orthopedic Surgery

## 2024-10-31 ENCOUNTER — Ambulatory Visit: Admitting: Anesthesiology

## 2024-10-31 ENCOUNTER — Encounter: Admission: RE | Disposition: A | Payer: Self-pay | Source: Home / Self Care | Attending: Orthopedic Surgery

## 2024-10-31 ENCOUNTER — Other Ambulatory Visit: Payer: Self-pay

## 2024-10-31 DIAGNOSIS — M25512 Pain in left shoulder: Secondary | ICD-10-CM | POA: Diagnosis present

## 2024-10-31 DIAGNOSIS — S43492A Other sprain of left shoulder joint, initial encounter: Secondary | ICD-10-CM | POA: Insufficient documentation

## 2024-10-31 DIAGNOSIS — F419 Anxiety disorder, unspecified: Secondary | ICD-10-CM | POA: Insufficient documentation

## 2024-10-31 DIAGNOSIS — K219 Gastro-esophageal reflux disease without esophagitis: Secondary | ICD-10-CM | POA: Diagnosis not present

## 2024-10-31 DIAGNOSIS — Z01818 Encounter for other preprocedural examination: Secondary | ICD-10-CM

## 2024-10-31 DIAGNOSIS — X58XXXA Exposure to other specified factors, initial encounter: Secondary | ICD-10-CM | POA: Diagnosis not present

## 2024-10-31 DIAGNOSIS — M75112 Incomplete rotator cuff tear or rupture of left shoulder, not specified as traumatic: Secondary | ICD-10-CM | POA: Diagnosis not present

## 2024-10-31 HISTORY — PX: SHOULDER ARTHROSCOPY WITH LABRAL REPAIR: SHX5691

## 2024-10-31 MED ORDER — ACETAMINOPHEN 10 MG/ML IV SOLN
INTRAVENOUS | Status: DC | PRN
  Administered 2024-10-31: 1000 mg via INTRAVENOUS

## 2024-10-31 MED ORDER — ONDANSETRON HCL 4 MG/2ML IJ SOLN
INTRAMUSCULAR | Status: DC | PRN
  Administered 2024-10-31: 4 mg via INTRAVENOUS

## 2024-10-31 MED ORDER — ONDANSETRON 4 MG PO TBDP: 4.0000 mg | ORAL_TABLET | Freq: Three times a day (TID) | ORAL | 0 refills | Status: AC | PRN

## 2024-10-31 MED ORDER — MIDAZOLAM HCL 2 MG/2ML IJ SOLN
INTRAMUSCULAR | Status: AC
  Filled 2024-10-31: qty 2

## 2024-10-31 MED ORDER — CHLORHEXIDINE GLUCONATE 0.12 % MT SOLN
OROMUCOSAL | Status: AC
  Filled 2024-10-31: qty 15

## 2024-10-31 MED ORDER — BUPIVACAINE LIPOSOME 1.3 % IJ SUSP
INTRAMUSCULAR | Status: DC | PRN
  Administered 2024-10-31: 20 mL via PERINEURAL

## 2024-10-31 MED ORDER — PHENYLEPHRINE HCL-NACL 20-0.9 MG/250ML-% IV SOLN
INTRAVENOUS | Status: DC | PRN
  Administered 2024-10-31: 30 ug/min via INTRAVENOUS

## 2024-10-31 MED ORDER — FENTANYL CITRATE (PF) 100 MCG/2ML IJ SOLN
INTRAMUSCULAR | Status: AC
  Filled 2024-10-31: qty 2

## 2024-10-31 MED ORDER — ORAL CARE MOUTH RINSE: 15.0000 mL | Freq: Once | OROMUCOSAL | Status: AC

## 2024-10-31 MED ORDER — LACTATED RINGERS IR SOLN
Status: DC | PRN
  Administered 2024-10-31: 1200 mL

## 2024-10-31 MED ORDER — CEFAZOLIN SODIUM-DEXTROSE 2-4 GM/100ML-% IV SOLN
INTRAVENOUS | Status: AC
  Filled 2024-10-31: qty 100

## 2024-10-31 MED ORDER — LACTATED RINGERS IV SOLN: INTRAVENOUS | Status: DC

## 2024-10-31 MED ORDER — ONDANSETRON HCL 4 MG/2ML IJ SOLN
4.0000 mg | Freq: Once | INTRAMUSCULAR | Status: AC
  Administered 2024-10-31: 4 mg via INTRAVENOUS

## 2024-10-31 MED ORDER — ACETAMINOPHEN 500 MG PO TABS: 1000.0000 mg | ORAL_TABLET | Freq: Three times a day (TID) | ORAL | 2 refills | Status: AC

## 2024-10-31 MED ORDER — FENTANYL CITRATE (PF) 50 MCG/ML IJ SOSY
PREFILLED_SYRINGE | INTRAMUSCULAR | Status: AC
  Filled 2024-10-31: qty 1

## 2024-10-31 MED ORDER — BUPIVACAINE HCL (PF) 0.5 % IJ SOLN
INTRAMUSCULAR | Status: AC
  Filled 2024-10-31: qty 10

## 2024-10-31 MED ORDER — FENTANYL CITRATE (PF) 100 MCG/2ML IJ SOLN
INTRAMUSCULAR | Status: DC | PRN
  Administered 2024-10-31: 50 ug via INTRAVENOUS

## 2024-10-31 MED ORDER — LIDOCAINE HCL (PF) 1 % IJ SOLN
INTRAMUSCULAR | Status: DC | PRN
  Administered 2024-10-31: 3 mL via SUBCUTANEOUS

## 2024-10-31 MED ORDER — PROPOFOL 10 MG/ML IV BOLUS
INTRAVENOUS | Status: AC
  Filled 2024-10-31: qty 20

## 2024-10-31 MED ORDER — EPINEPHRINE PF 1 MG/ML IJ SOLN
INTRAMUSCULAR | Status: AC
  Filled 2024-10-31: qty 1

## 2024-10-31 MED ORDER — DEXAMETHASONE SOD PHOSPHATE PF 10 MG/ML IJ SOLN
INTRAMUSCULAR | Status: DC | PRN
  Administered 2024-10-31: 5 mg via INTRAVENOUS

## 2024-10-31 MED ORDER — FENTANYL CITRATE (PF) 50 MCG/ML IJ SOSY
50.0000 ug | PREFILLED_SYRINGE | Freq: Once | INTRAMUSCULAR | Status: AC
  Administered 2024-10-31: 50 ug via INTRAVENOUS

## 2024-10-31 MED ORDER — BUPIVACAINE LIPOSOME 1.3 % IJ SUSP
INTRAMUSCULAR | Status: AC
  Filled 2024-10-31: qty 20

## 2024-10-31 MED ORDER — LIDOCAINE HCL (PF) 1 % IJ SOLN
INTRAMUSCULAR | Status: AC
  Filled 2024-10-31: qty 5

## 2024-10-31 MED ORDER — ASPIRIN 325 MG PO TBEC: 325.0000 mg | DELAYED_RELEASE_TABLET | Freq: Every day | ORAL | 0 refills | Status: AC

## 2024-10-31 MED ORDER — SUCCINYLCHOLINE CHLORIDE 200 MG/10ML IV SOSY
PREFILLED_SYRINGE | INTRAVENOUS | Status: DC | PRN
  Administered 2024-10-31: 160 mg via INTRAVENOUS

## 2024-10-31 MED ORDER — BUPIVACAINE HCL (PF) 0.5 % IJ SOLN
INTRAMUSCULAR | Status: DC | PRN
  Administered 2024-10-31: 10 mL via PERINEURAL

## 2024-10-31 MED ORDER — VANCOMYCIN HCL IN DEXTROSE 1-5 GM/200ML-% IV SOLN
INTRAVENOUS | Status: AC
  Filled 2024-10-31: qty 200

## 2024-10-31 MED ORDER — ONDANSETRON HCL 4 MG/2ML IJ SOLN
INTRAMUSCULAR | Status: AC
  Filled 2024-10-31: qty 2

## 2024-10-31 MED ORDER — ACETAMINOPHEN 10 MG/ML IV SOLN
INTRAVENOUS | Status: AC
  Filled 2024-10-31: qty 100

## 2024-10-31 MED ORDER — MIDAZOLAM HCL (PF) 2 MG/2ML IJ SOLN
1.0000 mg | INTRAMUSCULAR | Status: DC | PRN
  Administered 2024-10-31: 1 mg via INTRAVENOUS

## 2024-10-31 MED ORDER — PROPOFOL 10 MG/ML IV BOLUS
INTRAVENOUS | Status: DC | PRN
  Administered 2024-10-31: 200 mg via INTRAVENOUS

## 2024-10-31 MED ORDER — DEXAMETHASONE SOD PHOSPHATE PF 10 MG/ML IJ SOLN
INTRAMUSCULAR | Status: AC
  Filled 2024-10-31: qty 1

## 2024-10-31 MED ORDER — OXYCODONE HCL 5 MG PO TABS: 5.0000 mg | ORAL_TABLET | ORAL | 0 refills | Status: AC | PRN

## 2024-10-31 MED ORDER — PROPOFOL 500 MG/50ML IV EMUL
INTRAVENOUS | Status: DC | PRN
  Administered 2024-10-31: 25 ug/kg/min via INTRAVENOUS

## 2024-10-31 MED ORDER — CHLORHEXIDINE GLUCONATE 0.12 % MT SOLN
15.0000 mL | Freq: Once | OROMUCOSAL | Status: AC
  Administered 2024-10-31: 15 mL via OROMUCOSAL

## 2024-10-31 MED ORDER — CEFAZOLIN SODIUM-DEXTROSE 2-4 GM/100ML-% IV SOLN: 2.0000 g | INTRAVENOUS | Status: DC

## 2024-10-31 MED ORDER — VANCOMYCIN HCL 1000 MG IV SOLR
INTRAVENOUS | Status: DC | PRN
  Administered 2024-10-31: 1000 mg via INTRAVENOUS

## 2024-10-31 NOTE — Anesthesia Procedure Notes (Signed)
 Anesthesia Regional Block: Interscalene brachial plexus block   Pre-Anesthetic Checklist: , timeout performed,  Correct Patient, Correct Site, Correct Laterality,  Correct Procedure, Correct Position, site marked,  Risks and benefits discussed,  Surgical consent,  Pre-op evaluation,  At surgeon's request and post-op pain management  Laterality: Left and Upper  Prep: chloraprep       Needles:  Injection technique: Single-shot  Needle Type: Stimiplex     Needle Length: 5cm  Needle Gauge: 22     Additional Needles:   Procedures:,,,, ultrasound used (permanent image in chart),,    Narrative:  Start time: 10/31/2024 10:33 AM End time: 10/31/2024 10:35 AM Injection made incrementally with aspirations every 5 mL.  Performed by: Personally  Anesthesiologist: Dario Barter, MD  Additional Notes: Functioning IV was confirmed and monitors were applied.  A 50mm 22ga Stimuplex needle was used. Sterile prep and drape,hand hygiene and sterile gloves were used.  Negative aspiration and negative test dose prior to incremental administration of local anesthetic. The patient tolerated the procedure well.

## 2024-10-31 NOTE — Anesthesia Preprocedure Evaluation (Signed)
 "                                  Anesthesia Evaluation  Patient identified by MRN, date of birth, ID band Patient awake    Reviewed: Allergy & Precautions, NPO status , Patient's Chart, lab work & pertinent test results  History of Anesthesia Complications (+) PONV and history of anesthetic complications  Airway Mallampati: III  TM Distance: >3 FB Neck ROM: full    Dental  (+) Chipped, Dental Advidsory Given   Pulmonary neg pulmonary ROS, neg shortness of breath, neg recent URI   Pulmonary exam normal        Cardiovascular Exercise Tolerance: Good (-) angina negative cardio ROS Normal cardiovascular exam     Neuro/Psych  Headaches, neg Seizures PSYCHIATRIC DISORDERS Anxiety        GI/Hepatic Neg liver ROS,GERD  Controlled,,  Endo/Other  negative endocrine ROS    Renal/GU negative Renal ROS  negative genitourinary   Musculoskeletal   Abdominal   Peds  Hematology negative hematology ROS (+)   Anesthesia Other Findings Past Medical History: No date: Anemia     Comment:  YEARS AGO No date: Anxiety 06/27/2016: ASCUS of cervix with negative high risk HPV     Comment:  Formatting of this note might be different from the               original. Negative HPV 2015 and 2016 Normal Pap 2016 S/p               hysterectomy 2020 No date: Complication of anesthesia No date: GERD (gastroesophageal reflux disease) No date: PONV (postoperative nausea and vomiting)  Past Surgical History: No date: ABDOMINAL HYSTERECTOMY 2014: CHOLECYSTECTOMY 09/06/2018: DILATATION & CURETTAGE/HYSTEROSCOPY WITH MYOSURE; N/A     Comment:  Procedure: DILATATION & CURETTAGE/HYSTEROSCOPY WITH               MYOSURE-resection of polyps,submucosal polyps;  Surgeon:               Lovetta Debby PARAS, MD;  Location: [facility];                Service: Gynecology;  Laterality: N/A; 12/03/2017: HIP ARTHROSCOPY; Right     Comment:  Procedure: ARTHROSCOPY HIP;  Surgeon: Tobie Priest,  MD;               Location: [facility];  Service: Orthopedics;  Laterality:               Right; 05/09/2019: LAPAROSCOPIC SUPRACERVICAL HYSTERECTOMY; N/A     Comment:  Procedure: LAPAROSCOPIC SUPRACERVICAL HYSTERECTOMY,               Bilateral Salpingectomy;  Surgeon: Schermerhorn, Debby PARAS, MD;  Location: [facility] ORS;  Service: Gynecology;                Laterality: N/A;  BMI    Body Mass Index: 29.74 kg/m      Reproductive/Obstetrics negative OB ROS                              Anesthesia Physical Anesthesia Plan  ASA: 2  Anesthesia Plan: General   Post-op Pain Management: Regional block*   Induction: Intravenous  PONV Risk Score and Plan: 4 or greater and Ondansetron , Dexamethasone ,  Midazolam  and Treatment may vary due to age or medical condition  Airway Management Planned: Oral ETT  Additional Equipment:   Intra-op Plan:   Post-operative Plan: Extubation in OR  Informed Consent: I have reviewed the patients History and Physical, chart, labs and discussed the procedure including the risks, benefits and alternatives for the proposed anesthesia with the patient or authorized representative who has indicated his/her understanding and acceptance.     Dental Advisory Given  Plan Discussed with: Anesthesiologist, CRNA and Surgeon  Anesthesia Plan Comments: (Patient consented for risks of anesthesia including but not limited to:  - adverse reactions to medications - risk of airway placement if required - damage to eyes, teeth, lips or other oral mucosa - nerve damage due to positioning  - sore throat or hoarseness - Damage to heart, brain, nerves, lungs, other parts of body or loss of life  Patient voiced understanding.)        Anesthesia Quick Evaluation  "

## 2024-10-31 NOTE — H&P (Signed)
 Paper H&P to be scanned into permanent record. H&P reviewed. No significant changes noted.

## 2024-10-31 NOTE — Anesthesia Postprocedure Evaluation (Signed)
"   Anesthesia Post Note  Patient: Erica Wells  Procedure(s) Performed: ARTHROSCOPY, SHOULDER, WITH GLENOID LABRUM REPAIR (Left: Shoulder)  Patient location during evaluation: PACU Anesthesia Type: General Level of consciousness: awake and alert Pain management: pain level controlled Vital Signs Assessment: post-procedure vital signs reviewed and stable Respiratory status: spontaneous breathing, nonlabored ventilation, respiratory function stable and patient connected to nasal cannula oxygen Cardiovascular status: blood pressure returned to baseline and stable Postop Assessment: no apparent nausea or vomiting Anesthetic complications: no   No notable events documented.   Last Vitals:  Vitals:   10/31/24 1410 10/31/24 1428  BP: 97/62 103/72  Pulse: 80 80  Resp: 20 18  Temp:  36.6 C  SpO2: 100% 100%    Last Pain:  Vitals:   10/31/24 1428  TempSrc: Temporal  PainSc: 0-No pain                 Prentice Murphy      "

## 2024-10-31 NOTE — Transfer of Care (Signed)
 Immediate Anesthesia Transfer of Care Note  Patient: Erica Wells  Procedure(s) Performed: ARTHROSCOPY, SHOULDER, WITH GLENOID LABRUM REPAIR (Left: Shoulder)  Patient Location: PACU  Anesthesia Type:General  Level of Consciousness: awake, alert , oriented, and drowsy  Airway & Oxygen Therapy: Patient Spontanous Breathing and Patient connected to face mask oxygen  Post-op Assessment: Report given to RN, Post -op Vital signs reviewed and stable, and Patient moving all extremities  Post vital signs: Reviewed and stable  Last Vitals:  Vitals Value Taken Time  BP 101/77 10/31/24 13:37  Temp    Pulse 89 10/31/24 13:39  Resp 18 10/31/24 13:39  SpO2 97 % 10/31/24 13:39  Vitals shown include unfiled device data.  Last Pain:  Vitals:   10/31/24 0930  TempSrc: Temporal  PainSc: 4          Complications: No notable events documented.

## 2024-10-31 NOTE — Anesthesia Procedure Notes (Signed)
 Procedure Name: Intubation Date/Time: 10/31/2024 12:07 PM  Performed by: Myra Lawless, CRNAPre-anesthesia Checklist: Patient identified, Patient being monitored, Timeout performed, Emergency Drugs available and Suction available Patient Re-evaluated:Patient Re-evaluated prior to induction Oxygen Delivery Method: Circle system utilized Preoxygenation: Pre-oxygenation with 100% oxygen Induction Type: IV induction Ventilation: Mask ventilation without difficulty Laryngoscope Size: Mac and 3 Grade View: Grade I Tube type: Oral Tube size: 7.0 mm Number of attempts: 1 Airway Equipment and Method: Stylet Placement Confirmation: ETT inserted through vocal cords under direct vision, positive ETCO2 and breath sounds checked- equal and bilateral Secured at: 21 cm Tube secured with: Tape Dental Injury: Teeth and Oropharynx as per pre-operative assessment

## 2024-10-31 NOTE — Op Note (Signed)
 DATE: 10/31/2024    PRE-OP DIAGNOSIS:  1. Left shoulder posterior labral tear   POST-OP DIAGNOSIS:  1. Left shoulder posterior labral tear 2. Left shoulder partial-thickness upper border subscapularis tear   PROCEDURES:  1. Left shoulder arthroscopic posterior labral repair and capsulorraphy 2. Left arthroscopic rotator cuff repair (partial-thickness upper border subscapularis)   SURGEON: Earnestine HILARIO Blanch, MD   ASSISTANT: DOROTHA Krystal Doyne, PA    ANESTHESIA: General + Exparel  interscalene block   TOTAL IV FLUIDS: per anesthesia record   ESTIMATED BLOOD LOSS: minimal    DRAINS: None    SPECIMENS: None.    IMPLANTS:  [employer] Knotless Qfix x 2 Arthrex 3.68mm Knotless Corkscrew x 1   COMPLICATIONS: None    Indications:  Erica Wells  is a 44 y.o. female with an over 2-year history of left shoulder pain.  She has undergone extensive physical therapy.  She has also had 2 corticosteroid injections to the glenohumeral joint with excellent relief of her symptoms, but this has been short-lived.  Clinical exam was concerning for posterior labral tear. After discussion of risks, benefits, and alternatives to surgery, the patient elected to proceed.   Procedure Details  The patient was seen in the Holding Room. The risks, benefits, complications, treatment options, and expected outcomes were discussed with the patient. The risks and potential complications of the problem and proposed treatment were discussed. This includes but is not limited to failure to fully relieve pain, continued or recurrence of pain, continued instability, infection, neurovascular compromise, complications from anesthesia, stiff shoulder, bleeding, DVT, and reoperation. The patient concurred with the proposed plan, giving informed consent. The site of surgery was properly noted/marked.   The patient was placed on the OR bed in the beach chair position. All bony prominences were well padded. The patient was given  appropriate preoperative IV antibiotics. The upper extremity was prescrubbed with alcohol, prepped with Chloroprep, and draped in the usual sterile fashion. A Time Out Procedure was performed confirming correct patient, procedure, and laterality.   Exam under anesthesia showed: laxity anterior 1+, posterior 2+ with reproducible click.    An 11 blade was used to make a standard posterior viewing portal. A standard low inferior anterior portal was made as a working portal. I made an additional high anterolateral portal, which was used as the anterior viewing portal. We performed a thorough arthroscopic evaluation of the glenohumeral joint through both the anterior and posterior viewing portals.    Glenohumeral Joint: Articular cartilage of the humeral head and glenoid: Normal Synovium: injected and erythematous Anterior Labrum: Mild degenerative changes with sublabral foramen Posterior Labrum: posterior labral tear extending from 9-10 o'clock position Superior Labrum: Normal Inferior Labrum: normal Anterior Capsule: normal Inferior Capsule: normal  Posterior Capsule: patulous  Rotator interval: synovitic Superior glenohumeral ligament: normal  Middle glenohumeral ligament: normal  Inferior glenohumeral ligament: normal. No HAGL. Biceps: Normal Rotator cuff findings: normal supraspinatus, infraspinatus insertions; partial-thickness upper border tear of the subscapularis   Next, the frayed edges of the superior labrum, anterior labrum, and posterior labrum were debrided with a shaver. The injected synovium was debrided with a combination of shaver and arthrocare wand.    Next, arthroscopic repair of the subscapularis was performed. The lesser tuberosity footprint was prepared with a combination of electrocautery and an arthroscopic curette.  An Arthrex knotless corkscrew was placed into the lesser tuberosity footprint from the anterior portal.  A BirdBeak was used to shuttle the repair suture  through the upper border  of the subscapularis tendon.  The suture was then shuttled through the anchor. With the arm in neutral rotation, the repair was tensioned appropriately. This appropriately reduced the subscapularis tear.  The arm was then internally and externally rotated and the subscapularis was noted to move appropriately with rotation.  The remainder of the suture was then cut.  The arthroscope was then placed in the anterolateral viewing portal. The elbow was placed in a position of slight anterior and inferior traction to allow for improved visualization. Next, a portal of [place] was made along the posterior 1/3 of the acromion just inferior to the lateral edge using a spinal needle to confirm appropriate positioning.  A cannula was placed in the portal of [place]. An elevator was used to elevate the labrum off the glenoid posteriorly in the affected regions described above. A rasp and shaver were used to roughen the glenoid for improvement of healing.  We began by placing the first knotless Qfix at the 9:00 o'clock position using a curved guide.  Arthrex suture lasso was used to shuttle the stitch through the posteroinferior capsule and labrum adjacent to the anchor.  Appropriate passing mechanism was utilized and the suture was tensioned until the capsule and labrum were appropriately reduced with formation of an appropriate bumper.  The suture tail was cut flush with the articular cartilage. This process was repeated for an anchor at the 10:00 position. The repair was stable to probing, there was visible evidence of decreased joint space and capsular area with formation of an appropriate posterior bumper. The shaver was used to debride any loose bony fragments from drilling.  Fluid was evacuated from the joint and the arthroscope was removed.   That concluded the case. The wounds were closed with 3-0 nylon. Xeroform gauze and sterile dressings were applied.  Polar Care was placed. Patient  was placed in a shoulder immobilizer.  Patient was then awakened from anesthesia and transported to PACU without notable complication.     Of note, assistance from a PA was essential to performing the surgery.  PA was present for the entire surgery.  PA assisted with patient positioning, retraction, instrumentation, and wound closure. The surgery would have been more difficult and had longer operative time without PA assistance.    POSTOPERATIVE PLAN:  Patient will be discharged to home.  Physical therapy 3-4 days after surgery.  Return to the clinic 10-14 days postop for suture removal Maintain arm in immobilizer. NWB. PT to start within 1 week utilizing arthroscopic posterior labral repair rehab protocol. Add subscapularis restrictions from small/medium rotator cuff repair rehab protocol.

## 2024-10-31 NOTE — Discharge Instructions (Signed)
 Post-Op Instructions  1. Bracing: You will wear a shoulder immobilizer or sling for 4 weeks.  Keep arm in a neutral or externally rotated position using the pillow with the sling. Do NOT rest forearm against belly -- forearm shoulder be resting in a position pointing in front of the body.   2. Driving: No driving for 4 weeks post-op.  3. Activity: No active lifting for 2 months. Wrist, hand, and elbow motion only. Avoid lifting the upper arm away from the body except for hygiene. You are permitted to bend and straighten the elbow actively. You may use your hand and wrist for typing, writing, and managing utensils (cutting food). Do not lift more than a coffee cup for 8 weeks.  When sleeping or resting, inclined positions (recliner chair or wedge pillow) and a pillow under the forearm for support may provide better comfort for up to 4 weeks.  Avoid long distance travel for 4 weeks.  Return to all activities after labral repair normally takes 6 months on average. If rehab goes very well, you may be able to do most activities at 4 months, except overhead or contact sports.  4. Physical Therapy: Begins 3-4 days after surgery, and proceeds 2 times per week for the first 4 weeks, then 1-2 times per week from weeks 4-8 post-op.  5. Medications:  - You will be provided a prescription for narcotic pain medicine. After surgery, take 1-2 narcotic tablets every 4 hours if needed for severe pain.  - A prescription for anti-nausea medication will be provided in case the narcotic medicine causes nausea - take 1 tablet every 6 hours only if nauseated.   - Take tylenol  1000 mg every 8 hours for pain.  May stop tylenol  3 days after surgery if you are having minimal pain. - Take ASA 325mg /day x 2 weeks to help prevent DVT/PE (blood clots).   If you are taking prescription medication for anxiety, depression, insomnia, muscle spasm, chronic pain, or for attention deficit disorder, you are advised that you are at a  higher risk of adverse effects with use of narcotics post-op, including narcotic addiction/dependence, depressed breathing, death. If you use non-prescribed substances: alcohol, marijuana, cocaine, heroin, methamphetamines, etc., you are at a higher risk of adverse effects with use of narcotics post-op, including narcotic addiction/dependence, depressed breathing, death. You are advised that taking > 50 morphine milligram equivalents (MME) of narcotic pain medication per day results in twice the risk of overdose or death. For your prescription provided: oxycodone  5 mg - taking more than 6 tablets per day would result in > 50 morphine milligram equivalents (MME) of narcotic pain medication. Be advised that we will prescribe narcotics short-term, for acute post-operative pain only - 3 weeks for major operations such as shoulder repair/reconstruction surgeries.   6. Post-Op Appointment:  Your first post-op appointment will be 10-14 days post-op.  7. Work or School: For most, but not all procedures, we advise staying out of work or school for at least 1 to 2 weeks in order to recover from the stress of surgery and to allow time for healing.   If you need a work or school note this can be provided.   Post-operative Brace: Apply and remove the brace you received as you were instructed to at the time of fitting and as described in detail as the braces instructions for use indicate.  Wear the brace for the period of time prescribed by your physician.  The brace can be cleaned with soap  and water  and allowed to air dry only.  Should the brace result in increased pain, decreased feeling (numbness/tingling), increased swelling or an overall worsening of your medical condition, please contact your doctor immediately.  If an emergency situation occurs as a result of wearing the brace after normal business hours, please dial 911 and seek immediate medical attention.  Let your doctor know if you have any further  questions about the brace issued to you. Refer to the shoulder sling instructions for use if you have any questions regarding the correct fit of your shoulder sling.  [facility] Customer Care for Troubleshooting: [phone]  Video that illustrates how to properly use a shoulder sling: Instructions for Proper Use of an Orthopaedic Sling [email or web address].[email or web address]

## 2024-11-03 ENCOUNTER — Ambulatory Visit: Admitting: Obstetrics and Gynecology

## 2024-11-04 ENCOUNTER — Encounter: Payer: Self-pay | Admitting: Orthopedic Surgery

## 2024-12-10 ENCOUNTER — Ambulatory Visit: Admitting: Obstetrics and Gynecology
